# Patient Record
Sex: Female | Born: 1965
Health system: Southern US, Community
[De-identification: ages and names within clinical notes are randomized; demographics above are authoritative.]

## PROBLEM LIST (undated history)

## (undated) DIAGNOSIS — F988 Other specified behavioral and emotional disorders with onset usually occurring in childhood and adolescence: Secondary | ICD-10-CM

## (undated) DIAGNOSIS — I10 Essential (primary) hypertension: Secondary | ICD-10-CM

## (undated) DIAGNOSIS — Q2730 Arteriovenous malformation, site unspecified: Secondary | ICD-10-CM

## (undated) DIAGNOSIS — G4730 Sleep apnea, unspecified: Secondary | ICD-10-CM

## (undated) HISTORY — DX: Other specified behavioral and emotional disorders with onset usually occurring in childhood and adolescence: F98.8

---

## 1990-05-29 HISTORY — PX: TUBAL LIGATION: SHX77

## 1999-02-13 ENCOUNTER — Emergency Department (HOSPITAL_COMMUNITY): Admission: EM | Admit: 1999-02-13 | Discharge: 1999-02-13 | Payer: Self-pay | Admitting: Emergency Medicine

## 1999-02-13 ENCOUNTER — Encounter: Payer: Self-pay | Admitting: Emergency Medicine

## 1999-11-14 ENCOUNTER — Other Ambulatory Visit: Admission: RE | Admit: 1999-11-14 | Discharge: 1999-11-14 | Payer: Self-pay | Admitting: Obstetrics and Gynecology

## 2000-10-18 ENCOUNTER — Ambulatory Visit (HOSPITAL_COMMUNITY): Admission: RE | Admit: 2000-10-18 | Discharge: 2000-10-18 | Payer: Self-pay | Admitting: Internal Medicine

## 2000-10-19 ENCOUNTER — Encounter: Payer: Self-pay | Admitting: Internal Medicine

## 2000-10-29 ENCOUNTER — Encounter: Payer: Self-pay | Admitting: Internal Medicine

## 2000-10-29 ENCOUNTER — Encounter (INDEPENDENT_AMBULATORY_CARE_PROVIDER_SITE_OTHER): Payer: Self-pay | Admitting: *Deleted

## 2000-10-29 ENCOUNTER — Ambulatory Visit (HOSPITAL_COMMUNITY): Admission: RE | Admit: 2000-10-29 | Discharge: 2000-10-29 | Payer: Self-pay | Admitting: Internal Medicine

## 2001-05-29 HISTORY — PX: WISDOM TOOTH EXTRACTION: SHX21

## 2002-03-04 ENCOUNTER — Other Ambulatory Visit: Admission: RE | Admit: 2002-03-04 | Discharge: 2002-03-04 | Payer: Self-pay | Admitting: Gynecology

## 2003-08-12 ENCOUNTER — Ambulatory Visit (HOSPITAL_COMMUNITY): Admission: RE | Admit: 2003-08-12 | Discharge: 2003-08-12 | Payer: Self-pay | Admitting: Family Medicine

## 2004-02-02 ENCOUNTER — Other Ambulatory Visit: Admission: RE | Admit: 2004-02-02 | Discharge: 2004-02-02 | Payer: Self-pay | Admitting: Obstetrics and Gynecology

## 2004-02-04 ENCOUNTER — Encounter: Admission: RE | Admit: 2004-02-04 | Discharge: 2004-02-04 | Payer: Self-pay | Admitting: *Deleted

## 2004-08-19 ENCOUNTER — Ambulatory Visit (HOSPITAL_COMMUNITY): Admission: RE | Admit: 2004-08-19 | Discharge: 2004-08-19 | Payer: Self-pay | Admitting: Family Medicine

## 2004-09-21 ENCOUNTER — Inpatient Hospital Stay (HOSPITAL_COMMUNITY): Admission: AD | Admit: 2004-09-21 | Discharge: 2004-09-22 | Payer: Self-pay | Admitting: Family Medicine

## 2004-11-15 ENCOUNTER — Emergency Department (HOSPITAL_COMMUNITY): Admission: EM | Admit: 2004-11-15 | Discharge: 2004-11-15 | Payer: Self-pay | Admitting: *Deleted

## 2005-11-30 ENCOUNTER — Other Ambulatory Visit: Admission: RE | Admit: 2005-11-30 | Discharge: 2005-11-30 | Payer: Self-pay | Admitting: Obstetrics and Gynecology

## 2006-08-16 ENCOUNTER — Ambulatory Visit (HOSPITAL_COMMUNITY): Admission: RE | Admit: 2006-08-16 | Discharge: 2006-08-16 | Payer: Self-pay | Admitting: Family Medicine

## 2008-07-13 ENCOUNTER — Emergency Department (HOSPITAL_COMMUNITY): Admission: EM | Admit: 2008-07-13 | Discharge: 2008-07-13 | Payer: Self-pay | Admitting: Emergency Medicine

## 2009-03-12 ENCOUNTER — Ambulatory Visit (HOSPITAL_COMMUNITY): Admission: RE | Admit: 2009-03-12 | Discharge: 2009-03-12 | Payer: Self-pay | Admitting: Family Medicine

## 2009-04-20 ENCOUNTER — Encounter: Admission: RE | Admit: 2009-04-20 | Discharge: 2009-04-20 | Payer: Self-pay | Admitting: Endocrinology

## 2009-04-20 ENCOUNTER — Other Ambulatory Visit: Admission: RE | Admit: 2009-04-20 | Discharge: 2009-04-20 | Payer: Self-pay | Admitting: Interventional Radiology

## 2009-10-29 ENCOUNTER — Ambulatory Visit (HOSPITAL_COMMUNITY): Admission: RE | Admit: 2009-10-29 | Discharge: 2009-10-29 | Payer: Self-pay | Admitting: Endocrinology

## 2010-02-14 ENCOUNTER — Ambulatory Visit (HOSPITAL_COMMUNITY): Admission: RE | Admit: 2010-02-14 | Discharge: 2010-02-14 | Payer: Self-pay | Admitting: Family Medicine

## 2010-05-29 HISTORY — PX: PARTIAL HYSTERECTOMY: SHX80

## 2010-06-19 ENCOUNTER — Encounter: Payer: Self-pay | Admitting: Endocrinology

## 2010-06-20 LAB — COMPREHENSIVE METABOLIC PANEL
ALT: 15 U/L (ref 0–35)
AST: 16 U/L (ref 0–37)
Albumin: 3.6 g/dL (ref 3.5–5.2)
Alkaline Phosphatase: 51 U/L (ref 39–117)
Creatinine, Ser: 0.72 mg/dL (ref 0.4–1.2)
Glucose, Bld: 91 mg/dL (ref 70–99)
Total Bilirubin: 0.4 mg/dL (ref 0.3–1.2)

## 2010-06-20 LAB — URINALYSIS, ROUTINE W REFLEX MICROSCOPIC
Bilirubin Urine: NEGATIVE
Nitrite: NEGATIVE
Specific Gravity, Urine: >1.03 — ABNORMAL HIGH (ref 1.005–1.030)
Urine Glucose, Fasting: NEGATIVE mg/dL
Urobilinogen, UA: 0.2 mg/dL (ref 0.0–1.0)

## 2010-06-20 LAB — CBC
HCT: 33.7 % — ABNORMAL LOW (ref 36.0–46.0)
Hemoglobin: 11.1 g/dL — ABNORMAL LOW (ref 12.0–15.0)
RBC: 4.25 MIL/uL (ref 3.87–5.11)
WBC: 8.1 10*3/uL (ref 4.0–10.5)

## 2010-06-20 LAB — URINE MICROSCOPIC-ADD ON

## 2010-06-20 LAB — SURGICAL PCR SCREEN
MRSA, PCR: NEGATIVE
Staphylococcus aureus: NEGATIVE

## 2010-06-21 ENCOUNTER — Ambulatory Visit (HOSPITAL_COMMUNITY)
Admission: RE | Admit: 2010-06-21 | Discharge: 2010-06-22 | Payer: Self-pay | Source: Home / Self Care | Attending: Obstetrics and Gynecology | Admitting: Obstetrics and Gynecology

## 2010-06-21 ENCOUNTER — Encounter (INDEPENDENT_AMBULATORY_CARE_PROVIDER_SITE_OTHER): Payer: Self-pay | Admitting: Obstetrics and Gynecology

## 2010-06-22 LAB — CBC
HCT: 29.6 % — ABNORMAL LOW (ref 36.0–46.0)
MCH: 26.2 pg (ref 26.0–34.0)
RBC: 3.66 MIL/uL — ABNORMAL LOW (ref 3.87–5.11)
WBC: 11 10*3/uL — ABNORMAL HIGH (ref 4.0–10.5)

## 2010-07-10 NOTE — Discharge Summary (Signed)
[name], [name] NO.:  [id number]  MEDICAL RECORD NO.:  [id number]          PATIENT TYPE:  OIB  LOCATION:  9315                          FACILITY:  [facility]  PHYSICIAN:  Randye Lobo, M.D.   DATE OF BIRTH:  November 12, 1965  DATE OF ADMISSION:  06/21/2010 DATE OF DISCHARGE:  06/22/2010                              DISCHARGE SUMMARY   ADMISSION DIAGNOSIS:  Menometrorrhagia.  DISCHARGE DIAGNOSES: 1. Menometrorrhagia. 2. Status post da Vinci total laparoscopic hysterectomy.  SIGNIFICANT OPERATIONS AND PROCEDURES:  The patient underwent a da Vinci total laparoscopic hysterectomy on June 21, 2010, at the [facility] [place] under the direction of Dr. Conley Simmonds and with the assistance of Gretchen Short, PA-C and Dr. Aram Beecham Romine.  ADMISSION HISTORY AND PHYSICAL EXAMINATION:  The patient is a 45 year old para 3 Caucasian female, status post bilateral tubal ligation who presented with heavy and irregular menstruation and a request for definitive surgical treatment.  The patient's symptoms have not been satisfactorily controlled with medical therapy.  She has had a benign endometrial biopsy and an ultrasound documenting a small uterine fibroid.  The patient has a history of thyroid nodules and is euthyroid and not taking thyroid medications.  The patient's surgical history is significant for a tubal ligation and a primary cesarean section.  On physical examination, the abdomen is noted to be obese, soft and nontender without hepatosplenomegaly or organomegaly.  There is a well- healed Pfannenstiel incision.  Pelvic exam demonstrated normal external genitalia and urethra.  There was menstrual bleeding at the time of her preoperative exam.  There was no uterine descensus noted.  The uterus was small and nontender and there was no evidence of adnexal masses nor tenderness.  The patient's preoperative hemoglobin was 11.1 and her TSH was 0.945.  HOSPITAL  COURSE:  The patient was admitted on June 21, 2010, at which time she underwent a Primary school teacher laparoscopic hysterectomy which was performed without complication.  Surgical findings included a 1.5 cm left corpus luteum cyst.  There was no evidence of adhesive disease nor endometriosis.  Postoperatively, the patient had a benign surgical recovery.  She had a morphine PCA and ketorolac to control her postoperative pain.  She had a Foley catheter overnight.  Her Foley catheter was removed in the morning of June 22, 2010, she has not voided yet.  The patient is ambulating and tolerating a regular diet.  The patient's postop day #1, hemoglobin is 9.6.  Her pathology report is pending at the time of her discharge.  DISPOSITION:  The patient is found to be in good condition and ready for discharge on postop day #1. 1. The patient was to be discharged to home after she voids. 2. The patient will take the following medications; Percocet 5/325 mg     one to two p.o. q.4-6 h. p.r.n. pain, Aleve over-the-counter     medication 1-2 p.o. q.8 h. p.r.n. pain.     The patient will continue with her iron tablet 1 p.o. daily.  She will     also continue with her daily Concerta and Wellbutrin XL.  She will     discontinue her progesterone. 3. The patient will have decreased activity.  She will not work for     the next 6 weeks.  She will not do any heavy lifting or exercise     for 6 weeks.  She will not have sexual activity for 6 weeks.  She     will not drive for 1-2 weeks. 4. The patient will follow up in the office in 5 weeks for her first     postoperative visit. 5. The patient will call if she experiences any problems with fever,     nausea or vomiting, pain uncontrolled by her medication, heavy     vaginal bleeding, incisional drainage or redness, or any other     concerns.     Randye Lobo, M.D.     BES/MEDQ  D:  06/22/2010  T:  06/23/2010  Job:   [id number]  Electronically Signed by Conley Simmonds M.D. on 07/10/2010 08:44:10 AM

## 2010-07-10 NOTE — H&P (Signed)
NAMEBAILLEY, GUILFORD NO.:  [id number]  MEDICAL RECORD NO.:  [id number]         PATIENT TYPE:  WAMB  LOCATION:                                [facility]ACILITY[facility]  [facility]  PHYSICIAN:  Randye Lobo, M.D.   DATE OF BIRTH:  [birth year]  DATE OF ADMISSION:  06/21/2010 DATE OF DISCHARGE:                             HISTORY & PHYSICAL   CHIEF COMPLAINT:  Heavy and irregular vaginal bleeding.  HISTORY OF PRESENT ILLNESS:  The patient is a 45 year old gravida 3, para 3-0-0-3 Caucasian female, status post bilateral tubal ligation, who presents with heavy and irregular menstrual periods since April 2011, who now requests definitive surgical treatment.  The patient's menstruation has been occurring irregularly and when she does have bleeding, this can last for up to 11-12 days and is accompanied by clotting.  In the past, the patient has been treated with combined oral contraceptive pills which has caused elevation of her blood pressure and was accompanied by headaches.  The patient has also been treated with cyclic Provera therapy.  The patient had an ultrasound performed in April 2011 which documented a 7.29-mm posterior fibroid.  The endometrial stripe was 9.17 mm.  The right ovary was normal and the left ovary was not identified.  The patient had an endometrial biopsy performed on May 31, 2010, which documented benign proliferative endometrium with no atypia, hyperplasia, or malignancy.  The patient's hemoglobin on June 09, 2010, measured 11.1.  The patient does have a history of thyroid nodules by ultrasound in 2010.  Her TSH on June 09, 2010, measured 0.945, and the patient is not on any thyroid medication.  The patient is now requesting definitive surgical treatment of her bleeding.  PAST OBSTETRIC AND GYNECOLOGIC HISTORIES: Significant for 3 prior deliveries.  The patient's first delivery was  by cesarean section followed by 2 VBACs.  The patient is also status    post bilateral tubal ligation.  The patient's last Pap smear was  performed in April 2011 and this documented endometrial cells with no  evidence of intraepitheliallesions or malignancy.  Her last mammogram  was performed in October 2010 and was within normal limits.  PAST MEDICAL HISTOR: 1. Thyroid nodules.  SURGICAL HISTORY: 1. Status post cesarean section x1. 2. Status post bilateral tubal ligation.  MEDICATIONS:  Wellbutrin, Concerta, Provera 10 mg p.o. daily.  ALLERGIES:  No known drug allergies.  SOCIAL HISTORY:  The patient has been married for 11 years.  She has 3 children.  She denies the use of tobacco, alcohol, or illicit drugs.  FAMILY HISTORY:  Positive for breast cancer and the patient's mother diagnosed postmenopausally.  There is no family history of uterine, ovarian, or colon cancer.  REVIEW OF SYSTEMS:  The patient reports dizziness and no energy.  PHYSICAL EXAMINATION:   HEIGHT: 5 feet 3 inches. WEIGHT: 246 pounds. BLOOD PRESSURE: 118/70. HEENT:  Normocephalic, atraumatic. NECK:  There is a slightly firm, irregular thyroid palpated.  It is nontender. LUNGS:  Clear to auscultation bilaterally. HEART:  S1 and S2 with a regular rate and rhythm. ABDOMEN:  There is a well-healed Pfannenstiel incision.  The  abdomen is soft and nontender and without evidence of hepatosplenomegaly or organomegaly. BREASTS:  Demonstrates scattered fibrocystic change of the breasts bilaterally.  There is no dominant mass, evidence of retraction, evidence of nipple discharge, or axillary adenopathy. PELVIC:  Normal external genitalia and urethra.  There is menstrual blood in the vagina.  There are no lesions of the cervix or the vagina present.  The cervix is noted to be very high in the vaginal vault and there is no evidence of uterine descensus.  On bimanual exam, the uterus is small and nontender and there is no evidence of adnexal masses nor tenderness.  LABORATORY DATA:   Preoperative laboratory studies from June 09, 2010, hemoglobin 11.1, hematocrit 35.5, white blood cell count 8.2, platelets 352,000.  Sodium 138, potassium 4.5, chloride 103, bicarbonate 28, BUN 14, creatinine 0.85, glucose 87.  AST 24, ALT 21.  Total bilirubin 0.4.  TSH 0.945.  IMPRESSION:  The patient is a 45 year old para 3 female, status post bilateral tubal ligation, who presents with menometrorrhagia and mild anemia.  The patient has a very small fibroid based on prior ultrasound examination.  The patient has thyroid nodules and is euthyroid and not on medication.  PLAN:  The patient will undergo a Engineer, building services robotic total laparoscopic hysterectomy.  Risks, benefits, and alternatives have been discussed with the patient who wishes to proceed.     Randye Lobo, M.D.     BES/MEDQ  D:  06/20/2010  T:  06/20/2010  Job:  [id number]  Electronically Signed by Conley Simmonds M.D. on 07/10/2010 08:49:31 AM

## 2010-07-10 NOTE — Op Note (Signed)
NAMEPRESTINA, [name] NO.:  [id number]  MEDICAL RECORD NO.:  [id number]          PATIENT TYPE:  OIB  LOCATION:  9315                          FACILITY:  [facility]  PHYSICIAN:  Randye Lobo, M.D.   DATE OF BIRTH:  [birth year]  DATE OF PROCEDURE:  06/21/2010 DATE OF DISCHARGE:                              OPERATIVE REPORT   PREOPERATIVE DIAGNOSIS:  Menometrorrhagia.  POSTOPERATIVE DIAGNOSIS:  Menometrorrhagia.  PROCEDURE:  Da Vinci robotic total laparoscopic hysterectomy.  SURGEON:  Randye Lobo, MD.  ASSISTANTS: 1. Cynthia P. Romine, MD 2. Gretchen Short, PAC  PROCTOR:  Edwena Felty. Romine, MD>  ANESTHESIA:  General endotracheal, local with 0.25% Marcaine.  INTRAVENOUS FLUIDS:  2100 mL Ringers lactate.  ESTIMATED BLOOD LOSS:  75 mL.  URINE OUTPUT:  400 mL.  COMPLICATIONS:  None.  INDICATIONS FOR PROCEDURE:  The patient is a 45 year old para 3 Caucasian female, status post bilateral tubal ligation, who presented with heavy and irregular menstrual bleeding and a request for hysterectomy procedure.  The patient did not have satisfactory control of her symptoms with medical therapy.  Ultrasound has demonstrated a 7.29-mm posterior uterine fibroid.  An endometrial biopsy documented benign proliferative endometrium.  The patient has a surgical history significant for a prior cesarean section and a tubal ligation.  A plan is now made to proceed with a da Vinci total laparoscopic hysterectomy after risks, benefits, and alternatives are reviewed.  FINDINGS:  Laparoscopy demonstrated a normal uterus.  The fallopian tubes were consistent with prior bilateral tubal ligation.  There was a 1.5-cm left corpus luteum cyst.  The right ovary was unremarkable. There was no evidence of any adhesive disease nor endometriosis appreciated.  SPECIMENS:  The uterus and cervix were sent to pathology.  PROCEDURE:  The patient was reidentified in the preoperative hold  area. She received cefotetan for IV antibiotic prophylaxis and she received TED hose and PAS stockings for DVT prophylaxis.  The patient was escorted to the operating room.  The patient was placed on the operating room table on top of the bean bag.  The right arm was tucked and protected and the left arm was left on an arm board.  The patient's left arm was supported and the shoulders were supported and padded as well.  General endotracheal anesthesia was induced.  The patient was then placed in the stirrups.  The bean bag was inflated.  The patient received proper protection around the facial area.  The abdomen and the vagina were then sterilely prepped.  A Foley catheter was placed inside the bladder.  A speculum was placed inside the vagina.  The anterior cervical lip was grasped with a single-tooth tenaculum.  The uterus was sounded to 10 cm.  The cervix was then dilated to a #23 Pratt dilator.  The RUMI instrument was then placed inside the uterine cavity after the balloon was tested.  The balloon was inflated.  The remaining vaginal instruments were removed.  The patient was then sterilely draped.  The procedure began by creating a 12-mm incision above the umbilicus. Dissection down to the fascia was performed with an Allis clamp.  A 10- mm trocar was then inserted directly into the peritoneal cavity without difficulty.  The laparoscope confirmed proper placement.  A CO2 pneumoperitoneum was then achieved.  Three trocar sites were mapped on the abdomen.  The laparoscope was placed at this time and the abdomen was transilluminated at the proposed trocar sites in order to avoid any vessel injury.  A 7-mm incision was created 10 cm to the right of the umbilical incision and slightly superior to this.  The same was performed on the left-hand side.  The final third trocar site was placed in the right lower quadrant 10 cm lateral to the right upper port incision.  All trocars  were placed under direct visualization of the laparoscope.  The patient was then placed in the Trendelenburg position.  The robot was docked at this time.  Instruments were placed into the perineal cavity under the visualization of the laparoscope.  At this time, I went to the surgical console.  The procedure began by identifying the ureters bilaterally.  The mesosalpinx on the patient's left-hand side was then grasped with the PK instrument and was cauterized and cut with a laparoscopic scissors.  The same was performed along the left round ligament.  The left utero- ovarian ligament was then grasped with a PK instrument, cauterized and sharply divided with a laparoscopic scissors.  Dissection through the anterior and posterior leaves of the broad ligament was then performed with monopolar cautery.  The bladder flap was taken down with monopolar cautery.  The same procedure that was performed on the patient's left- hand side was then repeated on the right-hand side again after the right ureter was identified.  The bladder flap was taken down completely with monopolar cautery and sharp dissection with the scissors.  The uterine vessels were then skeletonized and were cauterized with a PK instrument and then cut with the scissors.  The remaining peritoneum was taken down posteriorly.  During this skeletonizing of the uterine vessels on the patient's left- hand side, there was a branch of the uterine artery that bled slightly and this was easily controlled with the PK instrument.  The KOH ring was then pushed up into the vagina so that it could be easily delineated.  Monopolar cautery was then used to circumscribe the vagina directly over the KOH ring.  The specimen was then completely freed and was retracted and pulled into the vagina.  The pelvis was then suctioned.  The vaginal cuff was identified. Hemostasis was good at this time.  The vaginal cuff was closed with a series of  three figure-of-eight sutures of 0 Vicryl and one simple suture of 0 Vicryl in the midline.  The pelvis was once again irrigated and suctioned.  All of the operative pedicles were examined and were noted to be hemostatic.  Each of the ureters were identified and noted to peristalse.  The robot was undocked at this point.  The trocars were removed under visualization of the laparoscope.  The umbilical trocar remained and the CO2 gas was released from within the peritoneal cavity.  The umbilical trocar was then removed.  The right lower quadrant and the umbilical incisions were closed along the fascia with simple through-and-through sutures of 0 Vicryl.  Skin incisions were closed with subcuticular sutures of 3-0 plain.  Dermabond was placed over the incisions.  The uterus and cervix were sent to pathology.  The patient was cleansed with Betadine, taken out of the dorsal lithotomy position, awakened and extubated.  She was  escorted to the recovery room in stable condition.  There were no complications to the procedure.  All needle, instrument, and sponge counts were correct.     Randye Lobo, M.D.     BES/MEDQ  D:  06/21/2010  T:  06/22/2010  Job:  [id number]  Electronically Signed by Conley Simmonds M.D. on 07/10/2010 08:53:22 AM

## 2010-09-13 LAB — URINALYSIS, ROUTINE W REFLEX MICROSCOPIC
Nitrite: NEGATIVE
Protein, ur: NEGATIVE mg/dL
Urobilinogen, UA: 0.2 mg/dL (ref 0.0–1.0)

## 2010-09-13 LAB — URINE MICROSCOPIC-ADD ON

## 2010-10-14 NOTE — H&P (Signed)
[name], [name] NO.:  [id number]   MEDICAL RECORD NO.:  [id number]          PATIENT TYPE:  INP   LOCATION:  A214                          FACILITY:  [facility]   PHYSICIAN:  Corrie Mckusick, M.D.  DATE OF BIRTH:  [birth year]   DATE OF ADMISSION:  09/21/2004  DATE OF DISCHARGE:  LH                                HISTORY & PHYSICAL   ADMISSION DIAGNOSIS:  Dyspnea.   ADDITIONAL DIAGNOSIS:  Chest pain.   ADMITTING CONDITION:  Guarded.   HISTORY OF PRESENT ILLNESS:  This is a 45 year old female with a history of  ADHD, but no other prior history of asthma or cardiac conditions, who  presents with one month of dyspnea.  I saw her on August 19, 2004, in the  office with a bronchitis and a history of pneumonia.  She was placed on  Augmentin, as well as Anaplex and albuterol at that time.  She had a chest x-  ray which was negative at that time.  She had no chest pain or other  cardiovascular symptomatology then.  She since that time has not used the  albuterol, but has continued to complain for a month of intermittent  dyspnea.  She does state that there is some chest heaviness in the  midepigastrium, as well as the midsternal area.  There is no radiation of  this pain, although she does have shoulder pain at times.  She does not  think this is related.  There is no associated diaphoresis or nausea.   She does have a strong family history of cardiac disease, so we had a good  discussion today in the office and decided to go ahead and put her in the  hospital for rule out and further workup.  She was given nitroglycerin in  the office with no improvement.  She was also given an aspirin.  EKG in the  office showed normal sinus rhythm, normal axis, normal intervals and no  acute ST changes.  No GERD-related symptoms.  She does not feel like this is  meal related at all.   PAST MEDICAL HISTORY:  ADHD.   PAST SURGICAL HISTORY:  1.  C-section.  2.  Tubal ligation.   MEDICATIONS ON ADMISSION:  Concerta 27 mg in the morning.   SOCIAL HISTORY:  She does not drink nor smoke.   ALLERGIES:  No known drug allergies.   FAMILY HISTORY:  Significant for coronary artery disease.   PHYSICAL EXAMINATION:  VITAL SIGNS:  Temperature 98.6 degrees, pulse 68,  respirations 16, blood pressure 120/78.  WEIGHT:  266 pounds.  GENERAL APPEARANCE:  A pleasant female in no acute distress.  HEENT:  Normocephalic and atraumatic.  Pupils equal, round and reactive to  light.  Extraocular muscles intact.  Nasopharynx clear.  NECK:  Supple.  No lymphadenopathy.  CHEST:  Clear to auscultation bilaterally.  CARDIOVASCULAR:  Regular rate and rhythm.  Normal S1 and S2.  No murmurs,  rubs or gallops.  ABDOMEN:  Soft, nontender and nondistended.  EXTREMITIES:  No cyanosis, clubbing or edema.   ASSESSMENT:  A 45 year old female  with dyspnea and chest heaviness.   PLAN:  1.  Admit to 2A telemetry.  2.  Rule out with cardiac enzymes q.8h. x 3.  3.  CBC and Chem-12 on admission.  4.  Fasting laboratories in the morning.  5.  EKG on admission.  6.  Echocardiogram.  7.  Chest CT and D-dimer on admission.  8.  Will consult cardiology for further workup.  9.  Will cover with Xopenex 1.25 mg nebulizers q.8h. while awake to see if      this improves her dyspnea.  10. Will continue to follow closely.       JCG/MEDQ  D:  09/21/2004  T:  09/21/2004  Job:  [id number]

## 2010-10-14 NOTE — Procedures (Signed)
[name], [name]              ACCOUNT NO.:  [id number]   MEDICAL RECORD NO.:  [id number]          PATIENT TYPE:  INP   LOCATION:  A214                          FACILITY:  [facility]   PHYSICIAN:  Dani Gobble, MD       DATE OF BIRTH:  05-05-1966   DATE OF PROCEDURE:  09/22/2004  DATE OF DISCHARGE:  09/22/2004                                  ECHOCARDIOGRAM   INDICATION:  Dyspnea, chest pain.   Technical quality of the study is adequate.   The aorta is within normal limits at 2.5 cm.   The left atrium is mildly dilated, measured at 4.6 cm. No obvious clots or  masses were appreciated and the patient appeared to be in sinus rhythm  during this procedure.   The interventricular septum and posterior wall are within normal limits in  thickness at 1.1 cm for each.   The aortic valve appeared thin, trileaflet, and pliable with normal leaflet  excursion. No significant aortic insufficiency is noted. Doppler  interrogation of the aortic valve is within normal limits.   The mitral valve also appeared structurally normal. There is flat coaptation  of the mitral valve leaflets without mitral valve prolapse noted. Trivial  mitral regurgitation is noted. Doppler interrogation of the mitral valve is  within normal limits.   The pulmonic valve is incompletely visualized, but appeared to be grossly  structurally normal.   The tricuspid valve also appears grossly structurally normal with trivial  tricuspid regurgitation suggested by Doppler.   The left ventricle is normal in size with the LVIDD measured at 4.8 cm and  the LVISD measured at 3.2 cm. Overall left ventricular systolic function is  normal and no regional wall motion abnormalities are noted. Doppler inflow  signal is normal for age.   The right ventricle is moderately dilated with preserved right ventricular  systolic function. The right atrium is mildly dilated.   IMPRESSION:  1.  MILD TO MODERATE LEFT ATRIAL ENLARGEMENT.  2.   TRIVIAL MITRAL AND TRICUSPID REGURGITATION.  3.  NORMAL LEFT VENTRICULAR SIZE AND SYSTOLIC FUNCTION WITHOUT REGIONAL WALL      MOTION ABNORMALITY NOTED.  4.  MODERATELY DILATED RIGHT VENTRICLE WITH PRESERVED RIGHT VENTRICULAR      SYSTOLIC FUNCTION.      AB/MEDQ  D:  09/22/2004  T:  09/22/2004  Job:  [id number]

## 2011-05-30 HISTORY — PX: HERNIA REPAIR: SHX51

## 2011-10-26 ENCOUNTER — Other Ambulatory Visit (HOSPITAL_COMMUNITY): Payer: Self-pay | Admitting: Family Medicine

## 2011-10-26 ENCOUNTER — Ambulatory Visit (HOSPITAL_COMMUNITY)
Admission: RE | Admit: 2011-10-26 | Discharge: 2011-10-26 | Disposition: A | Discharge status: Home / Self Care | Payer: BC Managed Care – PPO | Source: Ambulatory Visit | Attending: Family Medicine | Admitting: Family Medicine

## 2011-10-26 DIAGNOSIS — N200 Calculus of kidney: Secondary | ICD-10-CM | POA: Insufficient documentation

## 2011-10-26 DIAGNOSIS — M545 Low back pain, unspecified: Secondary | ICD-10-CM | POA: Insufficient documentation

## 2011-10-26 DIAGNOSIS — R109 Unspecified abdominal pain: Secondary | ICD-10-CM | POA: Insufficient documentation

## 2011-10-26 DIAGNOSIS — R319 Hematuria, unspecified: Secondary | ICD-10-CM | POA: Insufficient documentation

## 2011-11-16 ENCOUNTER — Encounter (INDEPENDENT_AMBULATORY_CARE_PROVIDER_SITE_OTHER): Payer: Self-pay | Admitting: *Deleted

## 2011-11-20 ENCOUNTER — Encounter (INDEPENDENT_AMBULATORY_CARE_PROVIDER_SITE_OTHER): Payer: Self-pay | Admitting: Internal Medicine

## 2011-11-20 ENCOUNTER — Ambulatory Visit (INDEPENDENT_AMBULATORY_CARE_PROVIDER_SITE_OTHER): Payer: BC Managed Care – PPO | Admitting: Internal Medicine

## 2011-11-20 VITALS — BP 118/70 | HR 60 | Temp 98.9°F | Ht 63.0 in | Wt 237.2 lb

## 2011-11-20 DIAGNOSIS — R1031 Right lower quadrant pain: Secondary | ICD-10-CM

## 2011-11-20 DIAGNOSIS — K625 Hemorrhage of anus and rectum: Secondary | ICD-10-CM

## 2011-11-20 DIAGNOSIS — G8929 Other chronic pain: Secondary | ICD-10-CM

## 2011-11-20 NOTE — Patient Instructions (Addendum)
CT abdomen and pelvis with CM.

## 2011-11-20 NOTE — Progress Notes (Signed)
Subjective:     Patient ID: [name], female   DOB: Aug 06, 1965, 46 y.o.   MRN: [id number]  HPI Referred by Dr. Phillips Odor for abdominal pain. C/o low back pain. Urinary frequency.  She also had umbilical pain. She made an appt with Dr Phillips Odor. She was seen. Urine showed she had blood.  She was started on Flomax. She did have a kidney stone. She has had 2 epiosodes of intense pain. She tell me that she felt a "knot" in her epigastric region. She tells me today that the only pain she is having is her lower back.  Her appetite is good. No weight loss. She has a BM daily.Stools are normal size. No melena . She occasionally see blood about 3 times a week when she wipes. No acid reflux.  10/26/2011 Abdominal film IMPRESSION:  1. Tiny stone in the mid left kidney, unchanged since the prior CT  scan.  2. Tiny densities in the right and left sides of the pelvis could  possibly represent distal ureteral calculi.  3. Otherwise benign-appearing abdomen and pelvis.  10/26/2011 NA 139, K 4.3, Chloride 104, Glucose 87, Total bioi 0.68, ALP 46, AST 17, ALT 21, Total protein 6.8, Albumin 4.1, H and H 13.8 and 40.3, MCV 84.8, Platelet ct 321.   Review of Systems see hpi Current Outpatient Prescriptions  Medication Sig Dispense Refill  . beta carotene w/minerals (OCUVITE) tablet Take 1 tablet by mouth daily.      . methylphenidate (CONCERTA) 36 MG CR tablet Take 36 mg by mouth every morning.       Past Medical History  Diagnosis Date  . ADD (attention deficit disorder)    Past Surgical History  Procedure Date  . Cesarean section   . Partial hysterectomy   . Tubal ligation    History   Social History  . Marital Status: Married    Spouse Name: N/A    Number of Children: N/A  . Years of Education: N/A   Occupational History  . Not on file.   Social History Main Topics  . Smoking status: Never Smoker   . Smokeless tobacco: Not on file  . Alcohol Use: Yes     rare occasion  . Drug Use:  Yes  . Sexually Active: Not on file   Other Topics Concern  . Not on file   Social History Narrative  . No narrative on file   Family Status  Relation Status Death Age  . Mother Alive     hypertension,   . Father Deceased     CAD  . Sister Alive     Two have CAD   Allergies no known allergies     Objective:   Physical Exam Filed Vitals:   11/20/11 0928  Height: 5\' 3"  (1.6 m)  Weight: 237 lb 3.2 oz (107.593 kg)   Alert and oriented. Skin warm and dry. Oral mucosa is moist.   . Sclera anicteric, conjunctivae is pink. Thyroid not enlarged. No cervical lymphadenopathy. Lungs clear. Heart regular rate and rhythm.  Abdomen is soft. Bowel sounds are positive. No hepatomegaly. No abdominal masses felt. Tenderness rt lower quadrant and umblical:slight. No edema to lower extremities. Patient is alert and oriented.      Assessment:    Umblical and rt lower quadrant tenderness ? Etiology.  umblical mass. Hx of kidney stones.     Plan:    May need a CT scan of abdomen with CM. Further recommendations once we have CT  back. Will get lab work from [place].

## 2011-11-24 ENCOUNTER — Encounter (INDEPENDENT_AMBULATORY_CARE_PROVIDER_SITE_OTHER): Payer: Self-pay

## 2011-11-27 ENCOUNTER — Ambulatory Visit (HOSPITAL_COMMUNITY)
Admission: RE | Admit: 2011-11-27 | Discharge: 2011-11-27 | Disposition: A | Discharge status: Home / Self Care | Payer: BC Managed Care – PPO | Source: Ambulatory Visit | Attending: Internal Medicine | Admitting: Internal Medicine

## 2011-11-27 DIAGNOSIS — K625 Hemorrhage of anus and rectum: Secondary | ICD-10-CM

## 2011-11-27 DIAGNOSIS — R9389 Abnormal findings on diagnostic imaging of other specified body structures: Secondary | ICD-10-CM | POA: Insufficient documentation

## 2011-11-27 DIAGNOSIS — R1031 Right lower quadrant pain: Secondary | ICD-10-CM | POA: Insufficient documentation

## 2011-11-27 DIAGNOSIS — R319 Hematuria, unspecified: Secondary | ICD-10-CM | POA: Insufficient documentation

## 2011-11-27 DIAGNOSIS — G8929 Other chronic pain: Secondary | ICD-10-CM | POA: Insufficient documentation

## 2011-11-27 DIAGNOSIS — N200 Calculus of kidney: Secondary | ICD-10-CM | POA: Insufficient documentation

## 2011-11-27 DIAGNOSIS — K439 Ventral hernia without obstruction or gangrene: Secondary | ICD-10-CM | POA: Insufficient documentation

## 2011-11-27 DIAGNOSIS — M545 Low back pain, unspecified: Secondary | ICD-10-CM | POA: Insufficient documentation

## 2011-11-27 MED ORDER — IOHEXOL 300 MG/ML  SOLN: 100.0000 mL | Freq: Once | INTRAMUSCULAR | Status: AC | PRN

## 2011-12-12 ENCOUNTER — Telehealth (INDEPENDENT_AMBULATORY_CARE_PROVIDER_SITE_OTHER): Payer: Self-pay

## 2011-12-12 NOTE — Telephone Encounter (Signed)
Patient return call--- okay per patient to move her appointment from 12/14/11 to 12/15/11 @ 2:45 w/Dr. Johna Sheriff.

## 2011-12-12 NOTE — Telephone Encounter (Signed)
Left voice message for patient to call our office, need to change her appointment to 12/15/11 @ 2:45 pm w/Dr. Johna Sheriff due to surgery add on for 12/14/11 before am office.

## 2011-12-14 ENCOUNTER — Ambulatory Visit (INDEPENDENT_AMBULATORY_CARE_PROVIDER_SITE_OTHER): Payer: BC Managed Care – PPO | Admitting: General Surgery

## 2011-12-15 ENCOUNTER — Encounter (INDEPENDENT_AMBULATORY_CARE_PROVIDER_SITE_OTHER): Payer: Self-pay | Admitting: General Surgery

## 2011-12-15 ENCOUNTER — Ambulatory Visit (INDEPENDENT_AMBULATORY_CARE_PROVIDER_SITE_OTHER): Payer: BC Managed Care – PPO | Admitting: General Surgery

## 2011-12-15 VITALS — BP 130/88 | HR 71 | Temp 98.4°F | Resp 16 | Ht 63.0 in | Wt 240.4 lb

## 2011-12-15 DIAGNOSIS — K439 Ventral hernia without obstruction or gangrene: Secondary | ICD-10-CM

## 2011-12-15 NOTE — Progress Notes (Signed)
Subjective:   abdominal pain, hernia  Patient ID: [name], female   DOB: 1966-03-22, 46 y.o.   MRN: [id number]  HPI Patient is a 46 year old female referred through the courtesy of Dr. Phillips Odor for apparent abdominal hernia.about 4 months ago the patient had a brief episode of fairly severe pain at her umbilicus. This resolved. She did however had a second episode of quite severe pain at her umbilicus and on lying down she was able to see and feel a lump in her abdomen just above the umbilicus about the size of a "goose egg". She has not had recurrent symptoms since that time. The patient saw Dr. Phillips Odor for this and was referredto Dr. Karilyn Cota history is also having some red blood on her toilet tissue. The patient was evaluated and a CT scan of the abdomen was obtained. I've reviewed this study and this shows a fat-containing anterior abdominal wall hernia just above the umbilicus, moderate size. There was also a tiny renal calculus and probable corpus luteum cyst. The patient is referred for evaluation. She has not had nausea or vomiting. She has had laparoscopic assisted hysterectomy as well as laparoscopic tubal ligation.  Past Medical History  Diagnosis Date  . ADD (attention deficit disorder)    Past Surgical History  Procedure Date  . Cesarean section   . Partial hysterectomy   . Tubal ligation    Current Outpatient Prescriptions  Medication Sig Dispense Refill  . beta carotene w/minerals (OCUVITE) tablet Take 1 tablet by mouth daily.      . methylphenidate (CONCERTA) 36 MG CR tablet Take 36 mg by mouth every morning.       No Known Allergies History  Substance Use Topics  . Smoking status: Never Smoker   . Smokeless tobacco: Not on file  . Alcohol Use: Yes     rare occasion     Review of Systems  Respiratory: Negative.   Cardiovascular: Negative.   Gastrointestinal: Positive for abdominal pain and anal bleeding. Negative for nausea and vomiting.       Objective:   Physical Exam General: Obese otherwise well-appearing Caucasian female Skin: Warm and dry HEENT: There is a palpable goiter greater on the left. Sclera nonicteric. No other masses. Lymph nodes: No cervical, supraclavicular, inguinal nodes palpable Lungs: Clear equal breath sounds without increased work of breathing Cardiac: Regular rate and rhythm. Peripheral pulses intact. No edema. Abdomen. Obese. Healed Pfannenstiel incision. I cannot see a definite trocar site in the periumbilical area. With the patient standing and straining there is a definite palpable reducible tender hernia presenting just above the umbilicus. With the patient lying there is some mild diffuse lower abdominal tenderness. No masses or organomegaly. Extremities: No edema Neurologic: Alert and oriented affect normal    Assessment:     Symptomatic abdominal wall hernia, possible incisional trocar site hernia versus primary supraumbilical hernia. Particularly with the patient's weight I think that a laparoscopic approach would be best. I discussed this with her including the indications for the surgery and risks of anesthetic complications, bleeding, infection, visceral injury, and recurrence. She understands and wants to proceed with repair.    Plan:     Laparoscopic repair of ventral hernia under general anesthesia with overnight hospitalization.

## 2011-12-27 ENCOUNTER — Telehealth (INDEPENDENT_AMBULATORY_CARE_PROVIDER_SITE_OTHER): Payer: Self-pay

## 2011-12-27 ENCOUNTER — Other Ambulatory Visit (INDEPENDENT_AMBULATORY_CARE_PROVIDER_SITE_OTHER): Payer: Self-pay

## 2011-12-27 DIAGNOSIS — K439 Ventral hernia without obstruction or gangrene: Secondary | ICD-10-CM

## 2011-12-27 MED ORDER — HYDROCODONE-ACETAMINOPHEN 5-325 MG PO TABS: 1.0000 | ORAL_TABLET | Freq: Four times a day (QID) | ORAL | Status: AC | PRN

## 2011-12-27 NOTE — Telephone Encounter (Signed)
Patient would like to see if she could move her surgery up to an earlier date than September.  She's having an increase in pain which not controlled well with OTC pain medications.

## 2011-12-27 NOTE — Telephone Encounter (Signed)
Patient called stating she's had an increase in abdominal pain, she's scheduled for Ventral Hernia Repair in September.  Patient would like to know if her surgery date can be moved up due to an increase in pain.  Patient denies having any nausea/vomiting or fever, patient having bowel movements.  Patient taking Tylenol & Ibuprofen for pain control.

## 2011-12-28 ENCOUNTER — Encounter (HOSPITAL_COMMUNITY): Payer: Self-pay | Admitting: Pharmacy Technician

## 2012-01-05 ENCOUNTER — Encounter (HOSPITAL_COMMUNITY): Payer: Self-pay

## 2012-01-05 ENCOUNTER — Encounter (HOSPITAL_COMMUNITY)
Admission: RE | Admit: 2012-01-05 | Discharge: 2012-01-05 | Disposition: A | Discharge status: Home / Self Care | Payer: BC Managed Care – PPO | Source: Ambulatory Visit | Attending: General Surgery | Admitting: General Surgery

## 2012-01-05 HISTORY — DX: Sleep apnea, unspecified: G47.30

## 2012-01-05 LAB — CBC
Hemoglobin: 13.1 g/dL (ref 12.0–15.0)
MCHC: 34.1 g/dL (ref 30.0–36.0)
Platelets: 277 10*3/uL (ref 150–400)
RDW: 13 % (ref 11.5–15.5)

## 2012-01-05 LAB — SURGICAL PCR SCREEN
MRSA, PCR: NEGATIVE
Staphylococcus aureus: NEGATIVE

## 2012-01-05 NOTE — Patient Instructions (Addendum)
20 VERLIE LIOTTA  01/05/2012   Your procedure is scheduled on:  01-09-2012  Report to [facility] at   AM.  Call this number if you have problems the morning of surgery: [phone]   Remember: bring driver for day of surgery   Do not eat food or drink liquids:After Midnight.  .  Take these medicines the morning of surgery with A SIP OF WATER: concerta, hydrocodone if needed   Do not wear jewelry or make up.  Do not wear lotions, powders, or perfumes.Do not wear deodorant.    Do not bring valuables to the hospital.  Contacts, dentures or bridgework may not be worn into surgery.  Leave suitcase in the car. After surgery it may be brought to your room.  For patients admitted to the hospital, checkout time is 11:00 AM the day    discharge                             Special Instructions: CHG Shower Use Special Wash: 1/2 bottle night before surgery and 1/2 bottle morning of surgery, use regular soap on face and front and back private area. Do  Not shave for 2 days before showers   Please read over the following fact sheets that you were given: MRSA Information  [facility] pre op nurse phone number [phone], call if needed

## 2012-01-09 ENCOUNTER — Ambulatory Visit (HOSPITAL_COMMUNITY): Payer: BC Managed Care – PPO | Admitting: Anesthesiology

## 2012-01-09 ENCOUNTER — Encounter (HOSPITAL_COMMUNITY)
Admission: RE | Disposition: A | Discharge status: Home / Self Care | Payer: Self-pay | Source: Ambulatory Visit | Attending: General Surgery

## 2012-01-09 ENCOUNTER — Inpatient Hospital Stay (HOSPITAL_COMMUNITY)
Admission: RE | Admit: 2012-01-09 | Discharge: 2012-01-12 | DRG: 160 | Disposition: A | Discharge status: Home / Self Care | Payer: BC Managed Care – PPO | Source: Ambulatory Visit | Attending: General Surgery | Admitting: General Surgery

## 2012-01-09 ENCOUNTER — Encounter (HOSPITAL_COMMUNITY): Payer: Self-pay | Admitting: Anesthesiology

## 2012-01-09 ENCOUNTER — Encounter (HOSPITAL_COMMUNITY): Payer: Self-pay | Admitting: *Deleted

## 2012-01-09 DIAGNOSIS — K436 Other and unspecified ventral hernia with obstruction, without gangrene: Principal | ICD-10-CM | POA: Diagnosis present

## 2012-01-09 DIAGNOSIS — K439 Ventral hernia without obstruction or gangrene: Secondary | ICD-10-CM

## 2012-01-09 DIAGNOSIS — E669 Obesity, unspecified: Secondary | ICD-10-CM | POA: Diagnosis present

## 2012-01-09 DIAGNOSIS — G8918 Other acute postprocedural pain: Secondary | ICD-10-CM | POA: Diagnosis not present

## 2012-01-09 DIAGNOSIS — Z01812 Encounter for preprocedural laboratory examination: Secondary | ICD-10-CM

## 2012-01-09 DIAGNOSIS — Z6841 Body Mass Index (BMI) 40.0 and over, adult: Secondary | ICD-10-CM

## 2012-01-09 HISTORY — PX: VENTRAL HERNIA REPAIR: SHX424

## 2012-01-09 SURGERY — REPAIR, HERNIA, VENTRAL, LAPAROSCOPIC
Anesthesia: General | Site: Abdomen | Wound class: Clean

## 2012-01-09 MED ORDER — ONDANSETRON HCL 4 MG PO TABS: 4.0000 mg | ORAL_TABLET | Freq: Four times a day (QID) | ORAL | Status: DC | PRN

## 2012-01-09 MED ORDER — HYDROMORPHONE HCL PF 1 MG/ML IJ SOLN
0.2500 mg | INTRAMUSCULAR | Status: DC | PRN
  Administered 2012-01-09 (×2): 0.5 mg via INTRAVENOUS

## 2012-01-09 MED ORDER — BIOTENE DRY MOUTH MT LIQD
15.0000 mL | Freq: Two times a day (BID) | OROMUCOSAL | Status: DC
  Administered 2012-01-09 – 2012-01-12 (×6): 15 mL via OROMUCOSAL

## 2012-01-09 MED ORDER — LIDOCAINE HCL (CARDIAC) 20 MG/ML IV SOLN
INTRAVENOUS | Status: DC | PRN
  Administered 2012-01-09: 100 mg via INTRAVENOUS

## 2012-01-09 MED ORDER — LACTATED RINGERS IV SOLN
INTRAVENOUS | Status: DC
  Administered 2012-01-09: 1000 mL via INTRAVENOUS

## 2012-01-09 MED ORDER — BUPIVACAINE-EPINEPHRINE (PF) 0.5% -1:200000 IJ SOLN
INTRAMUSCULAR | Status: AC
  Filled 2012-01-09: qty 10

## 2012-01-09 MED ORDER — ACETAMINOPHEN 10 MG/ML IV SOLN
INTRAVENOUS | Status: DC | PRN
  Administered 2012-01-09: 1000 mg via INTRAVENOUS

## 2012-01-09 MED ORDER — ONDANSETRON HCL 4 MG/2ML IJ SOLN
4.0000 mg | Freq: Four times a day (QID) | INTRAMUSCULAR | Status: DC | PRN
  Filled 2012-01-09: qty 2

## 2012-01-09 MED ORDER — ACETAMINOPHEN 10 MG/ML IV SOLN
INTRAVENOUS | Status: AC
  Filled 2012-01-09: qty 100

## 2012-01-09 MED ORDER — DEXAMETHASONE SODIUM PHOSPHATE 10 MG/ML IJ SOLN
INTRAMUSCULAR | Status: DC | PRN
  Administered 2012-01-09: 10 mg via INTRAVENOUS

## 2012-01-09 MED ORDER — OXYCODONE HCL 5 MG PO TABS: 5.0000 mg | ORAL_TABLET | Freq: Once | ORAL | Status: DC | PRN

## 2012-01-09 MED ORDER — BUPIVACAINE-EPINEPHRINE PF 0.5-1:200000 % IJ SOLN
INTRAMUSCULAR | Status: DC | PRN
  Administered 2012-01-09: 45 mL

## 2012-01-09 MED ORDER — MORPHINE SULFATE 2 MG/ML IJ SOLN
2.0000 mg | INTRAMUSCULAR | Status: DC | PRN
  Administered 2012-01-09 – 2012-01-11 (×5): 2 mg via INTRAVENOUS
  Administered 2012-01-11: 4 mg via INTRAVENOUS
  Filled 2012-01-09: qty 1
  Filled 2012-01-09: qty 2
  Filled 2012-01-09 (×4): qty 1

## 2012-01-09 MED ORDER — FENTANYL CITRATE 0.05 MG/ML IJ SOLN
INTRAMUSCULAR | Status: DC | PRN
  Administered 2012-01-09 (×5): 50 ug via INTRAVENOUS

## 2012-01-09 MED ORDER — CEFAZOLIN SODIUM-DEXTROSE 2-3 GM-% IV SOLR
INTRAVENOUS | Status: AC
  Filled 2012-01-09: qty 50

## 2012-01-09 MED ORDER — MIDAZOLAM HCL 5 MG/5ML IJ SOLN
INTRAMUSCULAR | Status: DC | PRN
  Administered 2012-01-09: 2 mg via INTRAVENOUS

## 2012-01-09 MED ORDER — HYDROMORPHONE HCL PF 1 MG/ML IJ SOLN
INTRAMUSCULAR | Status: AC
  Filled 2012-01-09: qty 1

## 2012-01-09 MED ORDER — METHYLPHENIDATE HCL ER (OSM) 18 MG PO TBCR
54.0000 mg | EXTENDED_RELEASE_TABLET | Freq: Every day | ORAL | Status: DC
Start: 2012-01-10 — End: 2012-01-12
  Filled 2012-01-09: qty 3

## 2012-01-09 MED ORDER — PROPOFOL 10 MG/ML IV EMUL
INTRAVENOUS | Status: DC | PRN
  Administered 2012-01-09: 250 mg via INTRAVENOUS

## 2012-01-09 MED ORDER — PROMETHAZINE HCL 25 MG/ML IJ SOLN: 6.2500 mg | INTRAMUSCULAR | Status: DC | PRN

## 2012-01-09 MED ORDER — CEFAZOLIN SODIUM-DEXTROSE 2-3 GM-% IV SOLR
2.0000 g | INTRAVENOUS | Status: AC
  Administered 2012-01-09: 2 g via INTRAVENOUS

## 2012-01-09 MED ORDER — MEPERIDINE HCL 50 MG/ML IJ SOLN: 6.2500 mg | INTRAMUSCULAR | Status: DC | PRN

## 2012-01-09 MED ORDER — ROCURONIUM BROMIDE 100 MG/10ML IV SOLN
INTRAVENOUS | Status: DC | PRN
  Administered 2012-01-09: 50 mg via INTRAVENOUS

## 2012-01-09 MED ORDER — OXYCODONE HCL 5 MG/5ML PO SOLN
5.0000 mg | Freq: Once | ORAL | Status: DC | PRN
  Filled 2012-01-09: qty 5

## 2012-01-09 MED ORDER — DEXTROSE IN LACTATED RINGERS 5 % IV SOLN
INTRAVENOUS | Status: DC
  Administered 2012-01-09: 1000 mL via INTRAVENOUS
  Administered 2012-01-10 – 2012-01-12 (×4): via INTRAVENOUS

## 2012-01-09 MED ORDER — GLYCOPYRROLATE 0.2 MG/ML IJ SOLN
INTRAMUSCULAR | Status: DC | PRN
  Administered 2012-01-09: .8 mg via INTRAVENOUS

## 2012-01-09 MED ORDER — ACETAMINOPHEN 10 MG/ML IV SOLN: 1000.0000 mg | Freq: Once | INTRAVENOUS | Status: DC | PRN

## 2012-01-09 MED ORDER — HEPARIN SODIUM (PORCINE) 5000 UNIT/ML IJ SOLN
5000.0000 [IU] | Freq: Once | INTRAMUSCULAR | Status: AC
  Administered 2012-01-09: 5000 [IU] via SUBCUTANEOUS
  Filled 2012-01-09: qty 1

## 2012-01-09 MED ORDER — ONDANSETRON HCL 4 MG/2ML IJ SOLN
INTRAMUSCULAR | Status: DC | PRN
  Administered 2012-01-09: 4 mg via INTRAVENOUS

## 2012-01-09 MED ORDER — NEOSTIGMINE METHYLSULFATE 1 MG/ML IJ SOLN
INTRAMUSCULAR | Status: DC | PRN
  Administered 2012-01-09: 5 mg via INTRAVENOUS

## 2012-01-09 MED ORDER — HEPARIN SODIUM (PORCINE) 5000 UNIT/ML IJ SOLN
5000.0000 [IU] | Freq: Three times a day (TID) | INTRAMUSCULAR | Status: DC
  Administered 2012-01-09 – 2012-01-12 (×8): 5000 [IU] via SUBCUTANEOUS
  Filled 2012-01-09 (×11): qty 1

## 2012-01-09 MED ORDER — OXYCODONE-ACETAMINOPHEN 5-325 MG PO TABS
1.0000 | ORAL_TABLET | ORAL | Status: DC | PRN
  Administered 2012-01-09 – 2012-01-12 (×10): 2 via ORAL
  Filled 2012-01-09 (×10): qty 2

## 2012-01-09 SURGICAL SUPPLY — 57 items
ADH SKN CLS APL DERMABOND .7 (GAUZE/BANDAGES/DRESSINGS)
APL SKNCLS STERI-STRIP NONHPOA (GAUZE/BANDAGES/DRESSINGS)
APPLIER CLIP 5 13 M/L LIGAMAX5 (MISCELLANEOUS)
APR CLP MED LRG 5 ANG JAW (MISCELLANEOUS)
BENZOIN TINCTURE PRP APPL 2/3 (GAUZE/BANDAGES/DRESSINGS) IMPLANT
BINDER ABD UNIV 12 45-62 (WOUND CARE) IMPLANT
BINDER ABDOMINAL 46IN 62IN (WOUND CARE)
CANISTER SUCTION 2500CC (MISCELLANEOUS) ×2 IMPLANT
CHLORAPREP W/TINT 26ML (MISCELLANEOUS) ×2 IMPLANT
CLIP APPLIE 5 13 M/L LIGAMAX5 (MISCELLANEOUS) IMPLANT
CLOTH BEACON ORANGE TIMEOUT ST (SAFETY) ×2 IMPLANT
COVER SURGICAL LIGHT HANDLE (MISCELLANEOUS) ×2 IMPLANT
DECANTER SPIKE VIAL GLASS SM (MISCELLANEOUS) ×2 IMPLANT
DERMABOND ADVANCED (GAUZE/BANDAGES/DRESSINGS)
DERMABOND ADVANCED .7 DNX12 (GAUZE/BANDAGES/DRESSINGS) IMPLANT
DEVICE SECURE STRAP 25 ABSORB (INSTRUMENTS) ×1 IMPLANT
DEVICE TROCAR PUNCTURE CLOSURE (ENDOMECHANICALS) IMPLANT
DISSECTOR BLUNT TIP ENDO 5MM (MISCELLANEOUS) ×1 IMPLANT
DRAPE LAPAROSCOPIC ABDOMINAL (DRAPES) ×2 IMPLANT
DRAPE UTILITY XL STRL (DRAPES) ×2 IMPLANT
ELECT REM PT RETURN 9FT ADLT (ELECTROSURGICAL) ×2
ELECTRODE REM PT RTRN 9FT ADLT (ELECTROSURGICAL) ×1 IMPLANT
GLOVE BIOGEL PI IND STRL 7.0 (GLOVE) ×1 IMPLANT
GLOVE BIOGEL PI INDICATOR 7.0 (GLOVE) ×1
GLOVE SURG SS PI 7.5 STRL IVOR (GLOVE) ×2 IMPLANT
GOWN STRL NON-REIN LRG LVL3 (GOWN DISPOSABLE) ×2 IMPLANT
GOWN STRL REIN XL XLG (GOWN DISPOSABLE) ×4 IMPLANT
KIT BASIN OR (CUSTOM PROCEDURE TRAY) ×2 IMPLANT
MESH VENTRALIGHT ST 4.5IN (Mesh General) ×1 IMPLANT
NDL SPNL 22GX3.5 QUINCKE BK (NEEDLE) ×1 IMPLANT
NEEDLE SPNL 22GX3.5 QUINCKE BK (NEEDLE) ×2 IMPLANT
NS IRRIG 1000ML POUR BTL (IV SOLUTION) ×2 IMPLANT
PEN SKIN MARKING BROAD (MISCELLANEOUS) ×2 IMPLANT
PENCIL BUTTON HOLSTER BLD 10FT (ELECTRODE) IMPLANT
SCALPEL HARMONIC ACE (MISCELLANEOUS) IMPLANT
SCISSORS LAP 5X35 DISP (ENDOMECHANICALS) IMPLANT
SET IRRIG TUBING LAPAROSCOPIC (IRRIGATION / IRRIGATOR) IMPLANT
SLEEVE ADV FIXATION 5X100MM (TROCAR) IMPLANT
SLEEVE Z-THREAD 5X100MM (TROCAR) IMPLANT
SOLUTION ANTI FOG 6CC (MISCELLANEOUS) ×2 IMPLANT
STRIP CLOSURE SKIN 1/2X4 (GAUZE/BANDAGES/DRESSINGS) IMPLANT
SUT MNCRL AB 4-0 PS2 18 (SUTURE) ×2 IMPLANT
SUT NOVA NAB GS-21 0 18 T12 DT (SUTURE) ×2 IMPLANT
SUT PROLENE 0 CT 1 CR/8 (SUTURE) IMPLANT
TACKER 5MM HERNIA 3.5CML NAB (ENDOMECHANICALS) ×1 IMPLANT
TOWEL OR 17X26 10 PK STRL BLUE (TOWEL DISPOSABLE) ×2 IMPLANT
TRAY FOLEY CATH 14FRSI W/METER (CATHETERS) IMPLANT
TRAY LAP CHOLE (CUSTOM PROCEDURE TRAY) ×2 IMPLANT
TROCAR ADV FIXATION 11X100MM (TROCAR) IMPLANT
TROCAR ADV FIXATION 5X100MM (TROCAR) IMPLANT
TROCAR BLADELESS OPT 5 75 (ENDOMECHANICALS) ×1 IMPLANT
TROCAR XCEL NON-BLD 11X100MML (ENDOMECHANICALS) IMPLANT
TROCAR Z-THREAD FIOS 11X100 BL (TROCAR) ×1 IMPLANT
TROCAR Z-THREAD FIOS 5X100MM (TROCAR) ×2 IMPLANT
TROCAR Z-THREAD SLEEVE 11X100 (TROCAR) IMPLANT
TUBING INSUFFLATION 10FT LAP (TUBING) ×2 IMPLANT
VENTRALIGHT MESH ST IMPLANT

## 2012-01-09 NOTE — H&P (View-Only) (Signed)
Subjective:   abdominal pain, hernia  Patient ID: [name], female   DOB: 09/07/1965, 46 y.o.   MRN: [id number]  HPI Patient is a 46-year-old female referred through the courtesy of Dr. Golding for apparent abdominal hernia.about 4 months ago the patient had a brief episode of fairly severe pain at her umbilicus. This resolved. She did however had a second episode of quite severe pain at her umbilicus and on lying down she was able to see and feel a lump in her abdomen just above the umbilicus about the size of a "goose egg". She has not had recurrent symptoms since that time. The patient saw Dr. Golding for this and was referredto Dr. Rehman history is also having some red blood on her toilet tissue. The patient was evaluated and a CT scan of the abdomen was obtained. I've reviewed this study and this shows a fat-containing anterior abdominal wall hernia just above the umbilicus, moderate size. There was also a tiny renal calculus and probable corpus luteum cyst. The patient is referred for evaluation. She has not had nausea or vomiting. She has had laparoscopic assisted hysterectomy as well as laparoscopic tubal ligation.  Past Medical History  Diagnosis Date  . ADD (attention deficit disorder)    Past Surgical History  Procedure Date  . Cesarean section   . Partial hysterectomy   . Tubal ligation    Current Outpatient Prescriptions  Medication Sig Dispense Refill  . beta carotene w/minerals (OCUVITE) tablet Take 1 tablet by mouth daily.      . methylphenidate (CONCERTA) 36 MG CR tablet Take 36 mg by mouth every morning.       No Known Allergies History  Substance Use Topics  . Smoking status: Never Smoker   . Smokeless tobacco: Not on file  . Alcohol Use: Yes     rare occasion     Review of Systems  Respiratory: Negative.   Cardiovascular: Negative.   Gastrointestinal: Positive for abdominal pain and anal bleeding. Negative for nausea and vomiting.       Objective:   Physical Exam General: Obese otherwise well-appearing Caucasian female Skin: Warm and dry HEENT: There is a palpable goiter greater on the left. Sclera nonicteric. No other masses. Lymph nodes: No cervical, supraclavicular, inguinal nodes palpable Lungs: Clear equal breath sounds without increased work of breathing Cardiac: Regular rate and rhythm. Peripheral pulses intact. No edema. Abdomen. Obese. Healed Pfannenstiel incision. I cannot see a definite trocar site in the periumbilical area. With the patient standing and straining there is a definite palpable reducible tender hernia presenting just above the umbilicus. With the patient lying there is some mild diffuse lower abdominal tenderness. No masses or organomegaly. Extremities: No edema Neurologic: Alert and oriented affect normal    Assessment:     Symptomatic abdominal wall hernia, possible incisional trocar site hernia versus primary supraumbilical hernia. Particularly with the patient's weight I think that a laparoscopic approach would be best. I discussed this with her including the indications for the surgery and risks of anesthetic complications, bleeding, infection, visceral injury, and recurrence. She understands and wants to proceed with repair.    Plan:     Laparoscopic repair of ventral hernia under general anesthesia with overnight hospitalization.      

## 2012-01-09 NOTE — Anesthesia Postprocedure Evaluation (Signed)
Anesthesia Post Note  Patient: [name]  Procedure(s) Performed: Procedure(s) (LRB): LAPAROSCOPIC VENTRAL HERNIA (N/A)  Anesthesia type: General  Patient location: PACU  Post pain: Pain level controlled  Post assessment: Post-op Vital signs reviewed  Last Vitals: BP 142/77  Pulse 68  Temp 36.9 C (Oral)  Resp 6  SpO2 100%  Post vital signs: Reviewed  Level of consciousness: sedated  Complications: No apparent anesthesia complications

## 2012-01-09 NOTE — Preoperative (Signed)
Beta Blockers   Reason not to administer Beta Blockers:Not Applicable 

## 2012-01-09 NOTE — Transfer of Care (Signed)
Immediate Anesthesia Transfer of Care Note  Patient: [name]  Procedure(s) Performed: Procedure(s) (LRB): LAPAROSCOPIC VENTRAL HERNIA (N/A)  Patient Location: PACU  Anesthesia Type: General  Level of Consciousness: awake, alert , oriented and patient cooperative  Airway & Oxygen Therapy: Patient Spontanous Breathing and Patient connected to face mask oxygen  Post-op Assessment: Report given to PACU RN, Post -op Vital signs reviewed and stable and Patient moving all extremities  Post vital signs: Reviewed and stable  Complications: No apparent anesthesia complications

## 2012-01-09 NOTE — Op Note (Signed)
Preoperative Diagnosis: VENTRAL HERNIA  Postoprative Diagnosis: VENTRAL HERNIA  Procedure: Procedure(s): LAPAROSCOPIC VENTRAL HERNIA REPAIR   Surgeon: Glenna Fellows T   Assistants: None  Anesthesia:  General endotracheal anesthesiaDiagnos  Indications:   Patient is a 46 year old female with a previous history of laparoscopic-assisted hysterectomy. She now presents with an enlarging symptomatic hernia at her umbilical port site. Exam reveals a 3-4 cm hernia mass that is tender and reducible. After discussion of options we have elected to proceed with laparoscopic repair particularly in light of her obesity. We have discussed the indications for the surgery and risks including anesthetic complications, bleeding, infection, visceral injury, recurrence, chronic pain. She understands and agrees to proceed.  Procedure Detail:  Patient is brought to the operating room, placed in the supine position on the operating table, and general endotracheal anesthesia induced. She received preoperative IV antibiotics. PAS were in place. Patient timeout was performed and correct procedure verified. Access was obtained with a 5 mm Optiview trocar in the left upper quadrant without difficulty and there was no evidence of trocar injury. Under direct vision an additional 5 mm trocar was placed in the left lateral abdomen and an 11 mm trocar in the left lower quadrant. There was chronically incarcerated omentum in the hernia defect which was just at or above the umbilicus. Using sharp and cautery and blunt dissection the omentum was completely reduced down out of the hernia sac. There was no bowel involvement. The hernia defect measured about 2-1/2 cm in diameter. I chose an 11.5 cm piece of Bard Seprafilm coated mesh. 8 0 Novafil stay sutures were placed around the periphery. After measuring internally for wide deployment of the mesh a corresponding stab incisions were made on the anterior abdominal wall. The mesh  was coiled and introduced into the abdomen and unfurled with the Seprafilm side down. The suture grasper was then used to retrieve the sutures up through the stab wounds and these were tied in place with nice broad taut deployment of the mesh. The Securestrap tacker was then used to further secure the mesh with 2 concentric rows. The abdomen was inspected for hemostasis or evidence of injury and everything looked fine. Trochars were removed and CO2 evacuated. Skin incisions were closed with subcuticular Monocryl and Dermabond. Sponge needle and instrument counts were correct   Estimated Blood Loss:  Minimal         Drains: none  Blood Given: none          Specimens: none        Complications:  * No complications entered in OR log *         Disposition: PACU - hemodynamically stable.         Condition: stable  Mariella Saa MD, FACS  01/09/2012, 2:09 PM

## 2012-01-09 NOTE — Anesthesia Preprocedure Evaluation (Addendum)
Anesthesia Evaluation  Patient identified by MRN, date of birth, ID band Patient awake    Reviewed: Allergy & Precautions, H&P , NPO status , Patient's Chart, lab work & pertinent test results  History of Anesthesia Complications (+) AWARENESS UNDER ANESTHESIA  Airway Mallampati: II TM Distance: >3 FB Neck ROM: Full    Dental  (+) Teeth Intact and Dental Advisory Given   Pulmonary sleep apnea and Continuous Positive Airway Pressure Ventilation ,  breath sounds clear to auscultation  Pulmonary exam normal       Cardiovascular negative cardio ROS  Rhythm:Regular Rate:Normal     Neuro/Psych    GI/Hepatic negative GI ROS, Neg liver ROS,   Endo/Other  negative endocrine ROS  Renal/GU negative Renal ROS     Musculoskeletal negative musculoskeletal ROS (+)   Abdominal (+) + obese,   Peds  Hematology negative hematology ROS (+)   Anesthesia Other Findings   Reproductive/Obstetrics                          Anesthesia Physical Anesthesia Plan  ASA: II  Anesthesia Plan: General   Post-op Pain Management:    Induction: Intravenous  Airway Management Planned: Oral ETT  Additional Equipment:   Intra-op Plan:   Post-operative Plan: Extubation in OR  Informed Consent: I have reviewed the patients History and Physical, chart, labs and discussed the procedure including the risks, benefits and alternatives for the proposed anesthesia with the patient or authorized representative who has indicated his/her understanding and acceptance.   Dental advisory given  Plan Discussed with: CRNA and Surgeon  Anesthesia Plan Comments:         Anesthesia Quick Evaluation

## 2012-01-09 NOTE — Addendum Note (Signed)
Addendum  created 01/09/12 1453 by Gaylan Gerold, MD   Modules edited:Orders, PRL Based Order Sets

## 2012-01-09 NOTE — Interval H&P Note (Signed)
History and Physical Interval Note:  01/09/2012 11:58 AM  [name]  has presented today for surgery, with the diagnosis of ventral hernia  The various methods of treatment have been discussed with the patient and family. After consideration of risks, benefits and other options for treatment, the patient has consented to  Procedure(s) (LRB): LAPAROSCOPIC VENTRAL HERNIA (N/A) as a surgical intervention .  The patient's history has been reviewed, patient examined, no change in status, stable for surgery.  I have reviewed the patient's chart and labs.  Questions were answered to the patient's satisfaction.     Aidaly Cordner T

## 2012-01-10 ENCOUNTER — Encounter (HOSPITAL_COMMUNITY): Payer: Self-pay | Admitting: General Surgery

## 2012-01-10 MED ORDER — DIPHENHYDRAMINE HCL 25 MG PO CAPS
25.0000 mg | ORAL_CAPSULE | ORAL | Status: DC | PRN
  Administered 2012-01-10 – 2012-01-12 (×2): 25 mg via ORAL
  Filled 2012-01-10 (×2): qty 1

## 2012-01-10 NOTE — Care Management Note (Signed)
    Page 1 of 1   01/12/2012     10:37:23 AM   CARE MANAGEMENT NOTE 01/12/2012  Patient:  [name], [name]   Account Number:  [id number]  Date Initiated:  01/10/2012  Documentation initiated by:  Lorenda Ishihara  Subjective/Objective Assessment:   46 yo female admitted s/p hernia repair. PTA lived at home with spouse     Action/Plan:   Anticipated DC Date:  01/12/2012   Anticipated DC Plan:  HOME/SELF CARE      DC Planning Services  CM consult      Choice offered to / List presented to:             Status of service:  Completed, signed off Medicare Important Message given?   (If response is "NO", the following Medicare IM given date fields will be blank) Date Medicare IM given:   Date Additional Medicare IM given:    Discharge Disposition:  HOME/SELF CARE  Per UR Regulation:  Reviewed for med. necessity/level of care/duration of stay  If discussed at Long Length of Stay Meetings, dates discussed:    Comments:

## 2012-01-10 NOTE — Progress Notes (Signed)
Patient ID: [name], female   DOB: 1965-11-15, 46 y.o.   MRN: [id number] 1 Day Post-Op  Subjective: A lot of pain when up, OK at rest  Objective: Vital signs in last 24 hours: Temp:  [97 F (36.1 C)-99 F (37.2 C)] 97.9 F (36.6 C) (08/14 0515) Pulse Rate:  [49-68] 52  (08/14 0515) Resp:  [6-20] 16  (08/14 0515) BP: (94-142)/(52-84) 115/65 mmHg (08/14 0515) SpO2:  [96 %-100 %] 100 % (08/14 0515) Weight:  [240 lb (108.863 kg)] 240 lb (108.863 kg) (08/13 1757) Last BM Date: 01/08/12  Intake/Output from previous day: 08/13 0701 - 08/14 0700 In: 2403.8 [I.V.:2403.8] Out: 2615 [Urine:2600; Blood:15] Intake/Output this shift:    General appearance: alert and no distress GI: abnormal findings:  mild tenderness in the entire abdomen Incision/Wound: Clean and dry  Lab Results:  No results found for this basename: WBC:2,HGB:2,HCT:2,PLT:2 in the last 72 hours BMET No results found for this basename: NA:2,K:2,CL:2,CO2:2,GLUCOSE:2,BUN:2,CREATININE:2,CALCIUM:2 in the last 72 hours   Studies/Results: No results found.  Anti-infectives: Anti-infectives     Start     Dose/Rate Route Frequency Ordered Stop   01/09/12 0944   ceFAZolin (ANCEF) IVPB 2 g/50 mL premix        2 g 100 mL/hr over 30 Minutes Intravenous 60 min pre-op 01/09/12 0944 01/09/12 1206          Assessment/Plan: s/p Procedure(s): LAPAROSCOPIC VENTRAL HERNIA Stable but still requiring IV meds and not getting up well Cont current Rx, prob home in AM   LOS: 1 day    Xoie Kreuser T 01/10/2012

## 2012-01-11 LAB — CBC
HCT: 35 % — ABNORMAL LOW (ref 36.0–46.0)
Hemoglobin: 11.7 g/dL — ABNORMAL LOW (ref 12.0–15.0)
MCHC: 33.4 g/dL (ref 30.0–36.0)
RBC: 3.97 MIL/uL (ref 3.87–5.11)
WBC: 9.3 10*3/uL (ref 4.0–10.5)

## 2012-01-11 NOTE — Progress Notes (Signed)
Patient ID: [name], female   DOB: [birth year], 46 y.o.   MRN: [id number] 2 Days Post-Op  Subjective: A lot of pain last night when getting in and out of bed. She is okay when lying still. No nausea or vomiting. Still requiring IV pain medication and does not feel she would be able to cope at home.  Objective: Vital signs in last 24 hours: Temp:  [98 F (36.7 C)-98.2 F (36.8 C)] 98.1 F (36.7 C) (08/15 0541) Pulse Rate:  [54-64] 62  (08/15 0541) Resp:  [16-18] 16  (08/15 0541) BP: (104-149)/(60-64) 104/60 mmHg (08/15 0541) SpO2:  [97 %-99 %] 99 % (08/15 0541) Last BM Date: 01/08/12  Intake/Output from previous day: 08/14 0701 - 08/15 0700 In: 1787.5 [I.V.:1787.5] Out: 1250 [Urine:1250] Intake/Output this shift:    General appearance: alert and no distress GI: mild tenderness around the incisions and operative site Incision/Wound: no drainage or erythema or other evidence of infection  Lab Results:  No results found for this basename: WBC:2,HGB:2,HCT:2,PLT:2 in the last 72 hours BMET No results found for this basename: NA:2,K:2,CL:2,CO2:2,GLUCOSE:2,BUN:2,CREATININE:2,CALCIUM:2 in the last 72 hours   Studies/Results: No results found.  Anti-infectives: Anti-infectives     Start     Dose/Rate Route Frequency Ordered Stop   01/09/12 0944   ceFAZolin (ANCEF) IVPB 2 g/50 mL premix        2 g 100 mL/hr over 30 Minutes Intravenous 60 min pre-op 01/09/12 0944 01/09/12 1206          Assessment/Plan: s/p Procedure(s): LAPAROSCOPIC VENTRAL HERNIA I don't see evidence of complication but she still requiring IV pain medication and not mobile enough for discharge. We will check CBC.   LOS: 2 days    Kaoru Rezendes T 01/11/2012

## 2012-01-12 MED ORDER — OXYCODONE-ACETAMINOPHEN 5-325 MG PO TABS: 1.0000 | ORAL_TABLET | ORAL | Status: AC | PRN

## 2012-01-12 NOTE — Discharge Summary (Signed)
   Patient ID: [name] [id number] 46 y.o. Oct 26, 1965  01/09/2012  Discharge date and time: 01/12/2012   Admitting Physician: Glenna Fellows T  Discharge Physician: Glenna Fellows T  Admission Diagnoses: VENTRAL HERNIA  Discharge Diagnoses: Same  Operations: Procedure(s): LAPAROSCOPIC VENTRAL HERNIA  Admission Condition: good  Discharged Condition: good  Indication for Admission: patient is a 46 year old female who presents with an enlarging ventral hernia. Umbilically history of laparoscopic GYN surgery. She is electively admitted for laparoscopic repair.  Hospital Course: the patient underwent an uneventful laparoscopic repair of approximately 2 cm hernia on the morning of admission. Postoperatively she had quite a bit of incisional pain for the first 2 days. This limited her mobility and she was observed in the hospital and required occasional IV pain medicine. She had no GI difficulties. By the third postoperative day her pain was dramatically improved. She was getting in and out of bed without difficulty. She is afebrile with normal vital signs. Abdomen is soft and nontender. Incisions are all clean and dry and healing well.   Disposition: Home  Patient Instructions:   Zafiro, Routson  Home Medication Instructions [id number]   Printed on:01/12/12 0755  Medication Information                    OVER THE COUNTER MEDICATION Take 1 tablet by mouth 2 (two) times daily. Ultra woman multivitamin           methylphenidate (CONCERTA) 54 MG CR tablet Take 54 mg by mouth every morning.           Iodine, Kelp, 0.15 MG TABS Take by mouth every morning.           oxyCODONE-acetaminophen (PERCOCET/ROXICET) 5-325 MG per tablet Take 1-2 tablets by mouth every 4 (four) hours as needed for pain.             Activity: no heavy lifting for 4 weeks Diet: regular diet Wound Care: none needed  Follow-up:  With Dr. Johna Sheriff in 3 weeks.  Signed: Mariella Saa MD, FACS  01/12/2012, 7:55 AM

## 2012-02-02 ENCOUNTER — Ambulatory Visit (INDEPENDENT_AMBULATORY_CARE_PROVIDER_SITE_OTHER): Payer: BC Managed Care – PPO | Admitting: General Surgery

## 2012-02-02 ENCOUNTER — Encounter (INDEPENDENT_AMBULATORY_CARE_PROVIDER_SITE_OTHER): Payer: Self-pay | Admitting: General Surgery

## 2012-02-02 VITALS — BP 128/82 | HR 71 | Temp 98.4°F | Resp 16 | Ht 63.0 in | Wt 246.4 lb

## 2012-02-02 DIAGNOSIS — Z09 Encounter for follow-up examination after completed treatment for conditions other than malignant neoplasm: Secondary | ICD-10-CM

## 2012-02-02 NOTE — Patient Instructions (Addendum)
No heavy lifting or straining for 2 weeks and then unrestricted activity

## 2012-02-02 NOTE — Progress Notes (Signed)
History: Patient returns 4 weeks following laparoscopic repair of her surgical hernia post laparoscopic hysterectomy. She reports only mild pain initially which now has resolved. She will occasionally feel a tiny bit of sticking when she twisted. She is happy with the result.  Exam: General: Appears well Abdomen: Incisions are all well healed. Nontender. The hernia repair feels solid.  Assessment and plan: Doing well following laparoscopic hernia repair. She is to return to work next week with no heavy lifting for the first 2 weeks. Return here as needed.

## 2012-02-16 ENCOUNTER — Encounter (INDEPENDENT_AMBULATORY_CARE_PROVIDER_SITE_OTHER): Payer: BC Managed Care – PPO | Admitting: General Surgery

## 2014-01-20 ENCOUNTER — Emergency Department (HOSPITAL_COMMUNITY): Payer: BC Managed Care – PPO

## 2014-01-20 ENCOUNTER — Encounter (HOSPITAL_COMMUNITY): Payer: Self-pay | Admitting: Emergency Medicine

## 2014-01-20 ENCOUNTER — Emergency Department (HOSPITAL_COMMUNITY)
Admission: EM | Admit: 2014-01-20 | Discharge: 2014-01-20 | Disposition: A | Discharge status: Home / Self Care | Payer: BC Managed Care – PPO | Attending: Emergency Medicine | Admitting: Emergency Medicine

## 2014-01-20 DIAGNOSIS — Z9981 Dependence on supplemental oxygen: Secondary | ICD-10-CM | POA: Insufficient documentation

## 2014-01-20 DIAGNOSIS — F988 Other specified behavioral and emotional disorders with onset usually occurring in childhood and adolescence: Secondary | ICD-10-CM | POA: Insufficient documentation

## 2014-01-20 DIAGNOSIS — Z87891 Personal history of nicotine dependence: Secondary | ICD-10-CM | POA: Insufficient documentation

## 2014-01-20 DIAGNOSIS — Z79899 Other long term (current) drug therapy: Secondary | ICD-10-CM | POA: Insufficient documentation

## 2014-01-20 DIAGNOSIS — R109 Unspecified abdominal pain: Secondary | ICD-10-CM

## 2014-01-20 DIAGNOSIS — R1012 Left upper quadrant pain: Secondary | ICD-10-CM | POA: Diagnosis not present

## 2014-01-20 DIAGNOSIS — G478 Other sleep disorders: Secondary | ICD-10-CM | POA: Diagnosis not present

## 2014-01-20 LAB — CBC WITH DIFFERENTIAL/PLATELET
Basophils Absolute: 0 K/uL (ref 0.0–0.1)
Basophils Relative: 0 % (ref 0–1)
Eosinophils Absolute: 0.1 K/uL (ref 0.0–0.7)
Eosinophils Relative: 1 % (ref 0–5)
HCT: 40.6 % (ref 36.0–46.0)
Hemoglobin: 13.4 g/dL (ref 12.0–15.0)
Lymphocytes Relative: 25 % (ref 12–46)
Lymphs Abs: 2.7 K/uL (ref 0.7–4.0)
MCH: 28 pg (ref 26.0–34.0)
MCHC: 33 g/dL (ref 30.0–36.0)
MCV: 84.8 fL (ref 78.0–100.0)
Monocytes Absolute: 0.7 K/uL (ref 0.1–1.0)
Monocytes Relative: 7 % (ref 3–12)
Neutro Abs: 7.2 K/uL (ref 1.7–7.7)
Neutrophils Relative %: 67 % (ref 43–77)
Platelets: 267 K/uL (ref 150–400)
RBC: 4.79 MIL/uL (ref 3.87–5.11)
RDW: 13.3 % (ref 11.5–15.5)
WBC: 10.8 K/uL — ABNORMAL HIGH (ref 4.0–10.5)

## 2014-01-20 LAB — URINE MICROSCOPIC-ADD ON

## 2014-01-20 LAB — COMPREHENSIVE METABOLIC PANEL
ALBUMIN: 3.8 g/dL (ref 3.5–5.2)
ALT: 16 U/L (ref 0–35)
ANION GAP: 17 — AB (ref 5–15)
AST: 15 U/L (ref 0–37)
Alkaline Phosphatase: 61 U/L (ref 39–117)
BILIRUBIN TOTAL: 0.4 mg/dL (ref 0.3–1.2)
BUN: 14 mg/dL (ref 6–23)
CHLORIDE: 101 meq/L (ref 96–112)
CO2: 23 mEq/L (ref 19–32)
CREATININE: 0.76 mg/dL (ref 0.50–1.10)
Calcium: 9.2 mg/dL (ref 8.4–10.5)
GFR calc Af Amer: >90 mL/min (ref >90)
GFR calc non Af Amer: >90 mL/min (ref >90)
Glucose, Bld: 106 mg/dL — ABNORMAL HIGH (ref 70–99)
Potassium: 4.6 mEq/L (ref 3.7–5.3)
Sodium: 141 mEq/L (ref 137–147)
TOTAL PROTEIN: 7.6 g/dL (ref 6.0–8.3)

## 2014-01-20 LAB — URINALYSIS, ROUTINE W REFLEX MICROSCOPIC
Bilirubin Urine: NEGATIVE
Glucose, UA: NEGATIVE mg/dL
Ketones, ur: NEGATIVE mg/dL
Leukocytes, UA: NEGATIVE
Nitrite: NEGATIVE
Protein, ur: NEGATIVE mg/dL
Specific Gravity, Urine: 1.026 (ref 1.005–1.030)
Urobilinogen, UA: 0.2 mg/dL (ref 0.0–1.0)
pH: 5 (ref 5.0–8.0)

## 2014-01-20 LAB — LIPASE, BLOOD: LIPASE: 23 U/L (ref 11–59)

## 2014-01-20 MED ORDER — IOHEXOL 300 MG/ML  SOLN
50.0000 mL | Freq: Once | INTRAMUSCULAR | Status: AC | PRN
  Administered 2014-01-20: 50 mL via ORAL

## 2014-01-20 MED ORDER — HYDROCODONE-ACETAMINOPHEN 5-325 MG PO TABS: 2.0000 | ORAL_TABLET | ORAL | Status: DC | PRN

## 2014-01-20 MED ORDER — MORPHINE SULFATE 4 MG/ML IJ SOLN
4.0000 mg | Freq: Once | INTRAMUSCULAR | Status: AC
  Administered 2014-01-20: 4 mg via INTRAVENOUS
  Filled 2014-01-20: qty 1

## 2014-01-20 MED ORDER — ONDANSETRON HCL 4 MG/2ML IJ SOLN
4.0000 mg | Freq: Once | INTRAMUSCULAR | Status: AC
  Administered 2014-01-20: 4 mg via INTRAVENOUS
  Filled 2014-01-20: qty 2

## 2014-01-20 MED ORDER — IOHEXOL 300 MG/ML  SOLN
100.0000 mL | Freq: Once | INTRAMUSCULAR | Status: AC | PRN
  Administered 2014-01-20: 100 mL via INTRAVENOUS

## 2014-01-20 MED ORDER — ONDANSETRON HCL 4 MG PO TABS: 4.0000 mg | ORAL_TABLET | Freq: Four times a day (QID) | ORAL | Status: DC

## 2014-01-20 NOTE — ED Notes (Signed)
Pt c/o left abd pain that started last Wed. Pt states that pain doesn't hurt very much until she gets up and moves around.  Pt states that she did have hernia surgery here couple years back.  Pt denies n/v/d.

## 2014-01-20 NOTE — ED Notes (Signed)
CT notified patient is ready for imaging 

## 2014-01-20 NOTE — ED Notes (Signed)
Patient transported to CT 

## 2014-01-20 NOTE — ED Notes (Signed)
Pt c/o LUQ abdominal pain x 1 week. Pt sts the pain is always there, but gets much worse when she moves or has to use the restroom. Area also tender to palpation. Pt had hernia surgery last year and had a large mesh placed. Pt sts she sometimes gets the same pain in the RUQ as well. Pt A&Ox4. Pt ambulatory to room 5.

## 2014-01-20 NOTE — Discharge Instructions (Signed)

## 2014-01-20 NOTE — ED Provider Notes (Signed)
CSN: [id number]     Arrival date & time 01/20/14  1633 History   First MD Initiated Contact with Patient 01/20/14 1819     Chief Complaint  Patient presents with  . Abdominal Pain     (Consider location/radiation/quality/duration/timing/severity/associated sxs/prior Treatment) HPI Comments: Patient complains of a six-day history of left-sided upper abdominal pain that is constant and worse with movement worse with palpation. Denies any falls or trauma. She denies any nausea, vomiting and diarrhea. She denies any fever, chills, urinary or vaginal symptoms. Pain reminds her of when she had her hernia surgery 2 years ago. Pain is now constant. It is worse with palpation and movement. She is able to eat and drink. No change in bowel habits. No chest pain or shortness of breath  The history is provided by the patient.    Past Medical History  Diagnosis Date  . ADD (attention deficit disorder)   . Sleep apnea     no cpap, sleep study 11-24-2010 on chart   Past Surgical History  Procedure Laterality Date  . Cesarean section  1989  . Partial hysterectomy  jan 2012  . Tubal ligation  1992  . Wisdom tooth extraction  2003  . Ventral hernia repair  01/09/2012    Procedure: LAPAROSCOPIC VENTRAL HERNIA;  Surgeon: Mariella Saa, MD;  Location: [facility];  Service: General;  Laterality: N/A;  Laparoscopic Ventral Hernia Repair  . Hernia repair  2013    Lap Ventral Hernia Repair   No family history on file. History  Substance Use Topics  . Smoking status: Former Smoker -- 5 years    Quit date: 05/29/1990  . Smokeless tobacco: Never Used  . Alcohol Use: No     Comment: rare occasion   OB History   Grav Para Term Preterm Abortions TAB SAB Ect Mult Living                 Review of Systems  Constitutional: Negative for activity change and appetite change.  Respiratory: Negative for cough, chest tightness and shortness of breath.   Cardiovascular: Negative for chest pain.   Gastrointestinal: Positive for abdominal pain. Negative for nausea, vomiting and constipation.  Genitourinary: Negative for dysuria, vaginal bleeding and vaginal discharge.  Musculoskeletal: Negative for arthralgias, back pain and myalgias.  Skin: Negative for rash.  Neurological: Negative for dizziness, weakness and headaches.  A complete 10 system review of systems was obtained and all systems are negative except as noted in the HPI and PMH.      Allergies  Review of patient's allergies indicates no known allergies.  Home Medications   Prior to Admission medications   Medication Sig Start Date End Date Taking? Authorizing Provider  methylphenidate (CONCERTA) 54 MG CR tablet Take 54 mg by mouth every morning.   Yes Historical Provider, MD  HYDROcodone-acetaminophen (NORCO/VICODIN) 5-325 MG per tablet Take 2 tablets by mouth every 4 (four) hours as needed. 01/20/14   Glynn Octave, MD  ondansetron (ZOFRAN) 4 MG tablet Take 1 tablet (4 mg total) by mouth every 6 (six) hours. 01/20/14   Glynn Octave, MD   BP 131/61  Pulse 64  Temp(Src) 98.3 F (36.8 C) (Oral)  Resp 18  SpO2 97% Physical Exam  Nursing note and vitals reviewed. Constitutional: She is oriented to person, place, and time. She appears well-developed and well-nourished. No distress.  HENT:  Head: Normocephalic and atraumatic.  Mouth/Throat: Oropharynx is clear and moist. No oropharyngeal exudate.  Eyes: Conjunctivae and EOM are  normal. Pupils are equal, round, and reactive to light.  Neck: Normal range of motion. Neck supple.  No meningismus.  Cardiovascular: Normal rate, regular rhythm, normal heart sounds and intact distal pulses.   No murmur heard. Pulmonary/Chest: Effort normal and breath sounds normal. No respiratory distress.  Abdominal: Soft. There is tenderness. There is no rebound and no guarding.  TTP LUQ pain. No guarding or rebound.  No appreciable hernia  Musculoskeletal: Normal range of motion. She  exhibits no edema and no tenderness.  No CVAT  Neurological: She is alert and oriented to person, place, and time. No cranial nerve deficit. She exhibits normal muscle tone. Coordination normal.  No ataxia on finger to nose bilaterally. No pronator drift. 5/5 strength throughout. CN 2-12 intact. Negative Romberg. Equal grip strength. Sensation intact. Gait is normal.   Skin: Skin is warm.  Psychiatric: She has a normal mood and affect. Her behavior is normal.    ED Course  Procedures (including critical care time) Labs Review Labs Reviewed  CBC WITH DIFFERENTIAL - Abnormal; Notable for the following:    WBC 10.8 (*)    All other components within normal limits  COMPREHENSIVE METABOLIC PANEL - Abnormal; Notable for the following:    Glucose, Bld 106 (*)    Anion gap 17 (*)    All other components within normal limits  URINALYSIS, ROUTINE W REFLEX MICROSCOPIC - Abnormal; Notable for the following:    Color, Urine AMBER (*)    APPearance CLOUDY (*)    Hgb urine dipstick TRACE (*)    All other components within normal limits  URINE MICROSCOPIC-ADD ON - Abnormal; Notable for the following:    Squamous Epithelial / LPF FEW (*)    Bacteria, UA FEW (*)    All other components within normal limits  LIPASE, BLOOD    Imaging Review Ct Abdomen Pelvis W Contrast  01/20/2014   CLINICAL DATA:  Left upper quadrant pain 1 week. History of umbilical hernia.  EXAM: CT ABDOMEN AND PELVIS WITH CONTRAST  TECHNIQUE: Multidetector CT imaging of the abdomen and pelvis was performed using the standard protocol following bolus administration of intravenous contrast.  CONTRAST:  50mL OMNIPAQUE IOHEXOL 300 MG/ML SOLN, OMNIPAQUE IOHEXOL 300 MG/ML SOLN  COMPARISON:  11/27/2011 and 07/13/2008  FINDINGS: Lung bases are within normal.  Abdominal images demonstrate a normal liver, spleen, pancreas, gallbladder and adrenal glands. Kidneys are normal in size without hydronephrosis. There is a 5 mm nonobstructing  stone over the mid pole of the left kidney. There are a few small bilateral renal cortical hypodensities with the largest measuring 1 cm over the upper pole left kidney likely cysts. Ureters are within normal. Appendix is normal. No free fluid or focal inflammation. No evidence of abdominal wall hernia.  Pelvic images demonstrate evidence of a prior partial hysterectomy. Ovaries, bladder and rectum are within normal. Remaining bones and soft tissues are unremarkable.  IMPRESSION: No acute findings in the abdomen/pelvis.  Nonobstructing 5 mm left renal stone.  Small bilateral renal cysts.   Electronically Signed   By: Elberta Fortis M.D.   On: 01/20/2014 21:27     EKG Interpretation None      MDM   Final diagnoses:  Abdominal pain, unspecified abdominal location   Left upper quadrant abdominal pain ongoing for the past week worse with palpation and movement. No appreciable hernia on exam.  Urinalysis negative.Labs at baseline.  CT scan shows no acute findings. No complications from previous surgery. Abdomen soft and  nontender. She's tolerating by mouth. Stable for discharge and follow up with PCP and surgeon.    Glynn Octave, MD 01/20/14 [id number]

## 2014-01-22 ENCOUNTER — Ambulatory Visit (INDEPENDENT_AMBULATORY_CARE_PROVIDER_SITE_OTHER): Payer: BC Managed Care – PPO | Admitting: General Surgery

## 2014-01-22 ENCOUNTER — Encounter (INDEPENDENT_AMBULATORY_CARE_PROVIDER_SITE_OTHER): Payer: Self-pay | Admitting: General Surgery

## 2014-01-22 VITALS — BP 126/78 | HR 77 | Temp 97.5°F | Ht 63.0 in | Wt 258.0 lb

## 2014-01-22 DIAGNOSIS — R1013 Epigastric pain: Secondary | ICD-10-CM

## 2014-01-22 NOTE — Patient Instructions (Signed)
Take Aleve to twice daily with food for the next 2 weeks. Stop if you develop stomach upset or burning. Consider using abdominal binder If pain is not going away after 3-4 weeks please call for a return appointment Call if symptoms are worsening or new symptoms develop. I do not see evidence of a recurrent hernia or surgical problem on your examination or CT scan.

## 2014-01-22 NOTE — Progress Notes (Signed)
Chief complaint abdominal pain  History: Patient returns to the office with a history of laparoscopic repair of trocar site hernia at the umbilicus done in 2013. She had been doing well with no problems. About a week ago she had the onset fairly suddenly of sharp pain just above her umbilicus and somewhat over to the left. This has persisted. It is related to motion such as getting up and down and goes away when she rests. No associated GI symptoms. It got somewhat worse this past weekend and she presented to the emergency department. A CT scan of the abdomen was obtained which I reviewed and this shows no acute abnormalities. Specifically I do not see any evidence of recurrent hernia or other issues around the surgical site in the abdominal wall.  Exam: BP 126/78  Pulse 77  Temp(Src) 97.5 F (36.4 C)  Ht  (1.6 m)  Wt 258 lb (117.028 kg)  BMI 45.71 kg/m2 General: Morbidly obese but otherwise well-appearing. Moving easily up onto and off the exam table Abdomen: No significant tenderness today. With her coughing and straining and doing Valsalva and sit up maneuvers I do not feel any evidence of recurrent hernia.  Assessment and plan: Acute abdominal pain periumbilical. I suspect this may be muscle strain or related to scar tissue around her mesh. I suspect this will resolve. No surgical issues seen. I recommended Naprosyn twice daily for 2 weeks and an abdominal binder. If she develops any worsening symptoms or if she is not clearly improved after 3-4 weeks she will call for return evaluation.

## 2015-01-13 ENCOUNTER — Other Ambulatory Visit (HOSPITAL_COMMUNITY): Payer: Self-pay | Admitting: Internal Medicine

## 2015-01-13 DIAGNOSIS — I7090 Unspecified atherosclerosis: Secondary | ICD-10-CM

## 2015-01-13 DIAGNOSIS — I708 Atherosclerosis of other arteries: Secondary | ICD-10-CM

## 2015-01-14 ENCOUNTER — Other Ambulatory Visit (HOSPITAL_COMMUNITY): Payer: Self-pay | Admitting: Internal Medicine

## 2015-01-15 ENCOUNTER — Ambulatory Visit (HOSPITAL_COMMUNITY)
Admission: RE | Admit: 2015-01-15 | Discharge: 2015-01-15 | Disposition: A | Discharge status: Home / Self Care | Payer: BLUE CROSS/BLUE SHIELD | Source: Ambulatory Visit | Attending: Internal Medicine | Admitting: Internal Medicine

## 2015-01-15 ENCOUNTER — Inpatient Hospital Stay (HOSPITAL_COMMUNITY): Admission: RE | Admit: 2015-01-15 | Payer: Self-pay | Source: Ambulatory Visit

## 2015-01-15 DIAGNOSIS — I7090 Unspecified atherosclerosis: Secondary | ICD-10-CM

## 2015-01-15 DIAGNOSIS — I708 Atherosclerosis of other arteries: Secondary | ICD-10-CM | POA: Insufficient documentation

## 2015-01-15 DIAGNOSIS — K760 Fatty (change of) liver, not elsewhere classified: Secondary | ICD-10-CM | POA: Diagnosis not present

## 2015-01-15 MED ORDER — IOHEXOL 350 MG/ML SOLN
100.0000 mL | Freq: Once | INTRAVENOUS | Status: AC | PRN
  Administered 2015-01-15: 100 mL via INTRAVENOUS

## 2015-01-22 ENCOUNTER — Other Ambulatory Visit (HOSPITAL_COMMUNITY): Payer: Self-pay

## 2015-03-12 ENCOUNTER — Encounter: Payer: Self-pay | Admitting: Internal Medicine

## 2015-03-12 ENCOUNTER — Other Ambulatory Visit (INDEPENDENT_AMBULATORY_CARE_PROVIDER_SITE_OTHER): Payer: BLUE CROSS/BLUE SHIELD

## 2015-03-12 ENCOUNTER — Ambulatory Visit (INDEPENDENT_AMBULATORY_CARE_PROVIDER_SITE_OTHER): Payer: BLUE CROSS/BLUE SHIELD | Admitting: Internal Medicine

## 2015-03-12 VITALS — BP 124/78 | HR 73 | Temp 97.8°F | Resp 12 | Ht 63.0 in | Wt 263.2 lb

## 2015-03-12 DIAGNOSIS — E042 Nontoxic multinodular goiter: Secondary | ICD-10-CM | POA: Diagnosis not present

## 2015-03-12 LAB — T4, FREE: Free T4: 1.02 ng/dL (ref 0.60–1.60)

## 2015-03-12 LAB — TSH: TSH: 1.3 u[IU]/mL (ref 0.35–4.50)

## 2015-03-12 LAB — T3, FREE: T3, Free: 3.2 pg/mL (ref 2.3–4.2)

## 2015-03-12 NOTE — Patient Instructions (Signed)
Please stop at the lab.  Let me know if you develop problems swallowing, choking or shortness of breath.  Please return in 1 year.

## 2015-03-12 NOTE — Progress Notes (Signed)
Patient ID: [name], female   DOB: 08-13-1965, 49 y.o.   MRN: [id number]   HPI  [name] is a 49 y.o.-year-old female, referred by her PCP, Dr. Phillips Odor, for evaluation for multinodular goiter.  She were dx'ed with thyroid nodules >> investigated 2002-2011, then had a recent chest CT scan >> thyroid nodules again. observed.  Reviewed history: 10/29/2000:  FNA of left cold thyroid nodule observed on a nuclear scan showed follicular epithelium and histiocytes           FNA of the isthmic nodule showed colloid and follicular cells including Hurthle cells 04/20/2009: FNA of left 1.5 x 1.1 x 1.4 cm thyroid nodule showed hyperplastic or adenomatous follicular lesion Thyroid U/S (10/29/2009): multiple thyroid nodules, no significant changes from previous ultrasound  Pt denies - + feeling nodules in neck - no hoarseness - no dysphagia - no choking - + Feels the need to clear her voice - + SOB with lying down,  has sleep apnea  No recent thyroid tests available for review.   Pt c/o: - + fatigue - +  Both: heat intolerance/cold intolerance - no tremors - no palpitations - + anxiety/no depression - no hyperdefecation/constipation - no weight loss/weight gain - no dry skin - no hair loss  + FH of thyroid ds.: thyroid surgery in [place]. No FH of thyroid cancer. No h/o radiation tx to head or neck.  No seaweed or kelp. No recent contrast studies (CT Angio 2 mo ago). No steroid use. No herbal supplements. No Biotin supplements or Hair, Skin and Nails vitamins.  ROS: Constitutional: see HPI, + nocturia Eyes: no blurry vision, no xerophthalmia ENT: no sore throat, no nodules palpated in throat, no dysphagia/odynophagia, no hoarseness Cardiovascular: no CP/SOB/palpitations/leg swelling Respiratory: + cough/+ SOB/+ wheezing Gastrointestinal: no N/V/D/C Musculoskeletal: no muscle/joint aches Skin: no rashes Neurological: no tremors/numbness/tingling/dizziness Psychiatric: no  depression/+ anxiety + low libido  Past Medical History  Diagnosis Date  . ADD (attention deficit disorder)   . Sleep apnea     no cpap, sleep study 11-24-2010 on chart   Past Surgical History  Procedure Laterality Date  . Cesarean section  1989  . Partial hysterectomy  jan 2012  . Tubal ligation  1992  . Wisdom tooth extraction  2003  . Ventral hernia repair  01/09/2012    Procedure: LAPAROSCOPIC VENTRAL HERNIA;  Surgeon: Mariella Saa, MD;  Location: [facility];  Service: General;  Laterality: N/A;  Laparoscopic Ventral Hernia Repair  . Hernia repair  2013    Lap Ventral Hernia Repair   Social History   Social History  . Marital Status: Married    Spouse Name: N/A  . Number of Children: 3   Occupational History  . [employer]   Social History Main Topics  . Smoking status: Former Smoker -- 5 years    Quit date: 1996  . Smokeless tobacco: Never Used  . Alcohol Use: No     Comment: rare occasion  . Drug Use: No   Current Outpatient Prescriptions on File Prior to Visit  Medication Sig Dispense Refill  . methylphenidate (CONCERTA) 54 MG CR tablet Take 54 mg by mouth every morning.     No current facility-administered medications on file prior to visit.   No Known Allergies   FH: HTN in father HL in father, mother, sister Heart disease in father Cancer in mother  PE: BP 124/78 mmHg  Pulse 73  Temp(Src) 97.8 F (36.6 C) (Oral)  Resp  12  Ht 5\' 3"  (1.6 m)  Wt 263 lb 3.2 oz (119.387 kg)  BMI 46.64 kg/m2  SpO2 97% Wt Readings from Last 3 Encounters:  03/12/15 263 lb 3.2 oz (119.387 kg)  01/22/14 258 lb (117.028 kg)  02/02/12 246 lb 6.4 oz (111.766 kg)   Constitutional: overweight, in NAD Eyes: PERRLA, EOMI, no exophthalmos ENT: moist mucous membranes, + thyromegaly - lumpy-bumpy thyroidno cervical lymphadenopathy Cardiovascular: RRR, No MRG Respiratory: CTA B Gastrointestinal: abdomen soft, NT, ND, BS+ Musculoskeletal: no deformities, strength intact in  all 4;  Skin: moist, warm, no rashes Neurological: no tremor with outstretched hands, DTR normal in all 4  ASSESSMENT: 1. MNG  PLAN: 1. MNG  - I reviewed the images of her thyroid ultrasound along with the patient. I pointed out that the dominant nodules are not very large, and 2 of them have been biopsied, with apparently benign results Since the biopsies were read before tBethesda criteria were implemented, it is unclear whether the follicular cells seen are benign or FLUS.  Otherwise, the nodules are: - Most hypoechoic - with microcalcifications versus colloid granules - with some internal blood flow - more wide than tall - Fairly well delimited from surrounding tissue Pt does not have a thyroid cancer family history or a personal history of RxTx to head/neck. All these would favor benignity.  - We discussed about repeating the thyroid ultrasound, now, 5 years after her previous ultrasound. Also, depending on the aspect of the nodules, we may need a rebiopsy. She agrees with this. - I explained that this is not cancer, we can continue to follow her on a yearly basis, and check another ultrasound in another year or 2. - she should let me know if she develops significant neck compression symptoms, in that case, we might need to do either lobectomy or thyroidectomy - I'll see her back in a year, assuming her FNA is normal. If FNA abnormal, we will meet sooner.  - We will check a TSH, free T4 and free T3 today - I advised pt to join my chart and I will send her the results through there    Appointment on 03/12/2015  Component Date Value Ref Range Status  . TSH 03/12/2015 1.30  0.35 - 4.50 uIU/mL Final  . Free T4 03/12/2015 1.02  0.60 - 1.60 ng/dL Final  . T3, Free 16/10/960410/14/2016 3.2  2.3 - 4.2 pg/mL Final   TFTs normal.  U/S pending. I will addend the results when they become available.

## 2015-03-15 DIAGNOSIS — E042 Nontoxic multinodular goiter: Secondary | ICD-10-CM | POA: Insufficient documentation

## 2015-10-15 DIAGNOSIS — Z6841 Body Mass Index (BMI) 40.0 and over, adult: Secondary | ICD-10-CM | POA: Diagnosis not present

## 2015-10-15 DIAGNOSIS — R197 Diarrhea, unspecified: Secondary | ICD-10-CM | POA: Diagnosis not present

## 2015-10-15 DIAGNOSIS — R1110 Vomiting, unspecified: Secondary | ICD-10-CM | POA: Diagnosis not present

## 2015-11-09 DIAGNOSIS — E049 Nontoxic goiter, unspecified: Secondary | ICD-10-CM | POA: Diagnosis not present

## 2015-11-12 DIAGNOSIS — Z6841 Body Mass Index (BMI) 40.0 and over, adult: Secondary | ICD-10-CM | POA: Diagnosis not present

## 2015-11-12 DIAGNOSIS — E782 Mixed hyperlipidemia: Secondary | ICD-10-CM | POA: Diagnosis not present

## 2015-11-12 DIAGNOSIS — R41840 Attention and concentration deficit: Secondary | ICD-10-CM | POA: Diagnosis not present

## 2016-01-31 DIAGNOSIS — Z6841 Body Mass Index (BMI) 40.0 and over, adult: Secondary | ICD-10-CM | POA: Diagnosis not present

## 2016-01-31 DIAGNOSIS — J209 Acute bronchitis, unspecified: Secondary | ICD-10-CM | POA: Diagnosis not present

## 2016-02-09 DIAGNOSIS — J209 Acute bronchitis, unspecified: Secondary | ICD-10-CM | POA: Diagnosis not present

## 2016-02-09 DIAGNOSIS — M25521 Pain in right elbow: Secondary | ICD-10-CM | POA: Diagnosis not present

## 2016-02-09 DIAGNOSIS — R41840 Attention and concentration deficit: Secondary | ICD-10-CM | POA: Diagnosis not present

## 2016-02-09 DIAGNOSIS — E782 Mixed hyperlipidemia: Secondary | ICD-10-CM | POA: Diagnosis not present

## 2016-05-26 DIAGNOSIS — R41840 Attention and concentration deficit: Secondary | ICD-10-CM | POA: Diagnosis not present

## 2016-05-26 DIAGNOSIS — E782 Mixed hyperlipidemia: Secondary | ICD-10-CM | POA: Diagnosis not present

## 2016-05-26 DIAGNOSIS — J019 Acute sinusitis, unspecified: Secondary | ICD-10-CM | POA: Diagnosis not present

## 2016-05-26 DIAGNOSIS — Z6841 Body Mass Index (BMI) 40.0 and over, adult: Secondary | ICD-10-CM | POA: Diagnosis not present

## 2016-09-22 DIAGNOSIS — R05 Cough: Secondary | ICD-10-CM | POA: Diagnosis not present

## 2016-09-22 DIAGNOSIS — R41840 Attention and concentration deficit: Secondary | ICD-10-CM | POA: Diagnosis not present

## 2016-09-22 DIAGNOSIS — B351 Tinea unguium: Secondary | ICD-10-CM | POA: Diagnosis not present

## 2016-09-22 DIAGNOSIS — E782 Mixed hyperlipidemia: Secondary | ICD-10-CM | POA: Diagnosis not present

## 2016-10-31 ENCOUNTER — Other Ambulatory Visit (HOSPITAL_COMMUNITY): Payer: Self-pay | Admitting: Physician Assistant

## 2016-10-31 DIAGNOSIS — Z1231 Encounter for screening mammogram for malignant neoplasm of breast: Secondary | ICD-10-CM

## 2016-11-03 ENCOUNTER — Ambulatory Visit (HOSPITAL_COMMUNITY)
Admission: RE | Admit: 2016-11-03 | Discharge: 2016-11-03 | Disposition: A | Discharge status: Home / Self Care | Payer: BLUE CROSS/BLUE SHIELD | Source: Ambulatory Visit | Attending: Physician Assistant | Admitting: Physician Assistant

## 2016-11-03 DIAGNOSIS — Z1231 Encounter for screening mammogram for malignant neoplasm of breast: Secondary | ICD-10-CM | POA: Insufficient documentation

## 2017-01-05 DIAGNOSIS — Z0000 Encounter for general adult medical examination without abnormal findings: Secondary | ICD-10-CM | POA: Diagnosis not present

## 2017-01-05 DIAGNOSIS — Z1389 Encounter for screening for other disorder: Secondary | ICD-10-CM | POA: Diagnosis not present

## 2017-01-05 DIAGNOSIS — Z6841 Body Mass Index (BMI) 40.0 and over, adult: Secondary | ICD-10-CM | POA: Diagnosis not present

## 2017-03-02 DIAGNOSIS — F329 Major depressive disorder, single episode, unspecified: Secondary | ICD-10-CM | POA: Diagnosis not present

## 2017-03-02 DIAGNOSIS — Z6841 Body Mass Index (BMI) 40.0 and over, adult: Secondary | ICD-10-CM | POA: Diagnosis not present

## 2017-03-02 DIAGNOSIS — J019 Acute sinusitis, unspecified: Secondary | ICD-10-CM | POA: Diagnosis not present

## 2017-03-23 DIAGNOSIS — R41840 Attention and concentration deficit: Secondary | ICD-10-CM | POA: Diagnosis not present

## 2017-03-23 DIAGNOSIS — M26601 Right temporomandibular joint disorder, unspecified: Secondary | ICD-10-CM | POA: Diagnosis not present

## 2017-03-23 DIAGNOSIS — Z6841 Body Mass Index (BMI) 40.0 and over, adult: Secondary | ICD-10-CM | POA: Diagnosis not present

## 2017-03-23 DIAGNOSIS — F329 Major depressive disorder, single episode, unspecified: Secondary | ICD-10-CM | POA: Diagnosis not present

## 2017-04-09 DIAGNOSIS — Z6841 Body Mass Index (BMI) 40.0 and over, adult: Secondary | ICD-10-CM | POA: Diagnosis not present

## 2017-04-09 DIAGNOSIS — J209 Acute bronchitis, unspecified: Secondary | ICD-10-CM | POA: Diagnosis not present

## 2017-04-09 DIAGNOSIS — J019 Acute sinusitis, unspecified: Secondary | ICD-10-CM | POA: Diagnosis not present

## 2017-08-02 DIAGNOSIS — E782 Mixed hyperlipidemia: Secondary | ICD-10-CM | POA: Diagnosis not present

## 2017-08-02 DIAGNOSIS — Z6841 Body Mass Index (BMI) 40.0 and over, adult: Secondary | ICD-10-CM | POA: Diagnosis not present

## 2017-08-02 DIAGNOSIS — R5383 Other fatigue: Secondary | ICD-10-CM | POA: Diagnosis not present

## 2017-08-02 DIAGNOSIS — F329 Major depressive disorder, single episode, unspecified: Secondary | ICD-10-CM | POA: Diagnosis not present

## 2017-08-02 DIAGNOSIS — R41840 Attention and concentration deficit: Secondary | ICD-10-CM | POA: Diagnosis not present

## 2017-08-17 DIAGNOSIS — H8111 Benign paroxysmal vertigo, right ear: Secondary | ICD-10-CM | POA: Diagnosis not present

## 2017-08-17 DIAGNOSIS — F329 Major depressive disorder, single episode, unspecified: Secondary | ICD-10-CM | POA: Diagnosis not present

## 2017-08-17 DIAGNOSIS — Z6841 Body Mass Index (BMI) 40.0 and over, adult: Secondary | ICD-10-CM | POA: Diagnosis not present

## 2017-09-16 DIAGNOSIS — J018 Other acute sinusitis: Secondary | ICD-10-CM | POA: Diagnosis not present

## 2017-09-16 DIAGNOSIS — R062 Wheezing: Secondary | ICD-10-CM | POA: Diagnosis not present

## 2017-09-22 DIAGNOSIS — Z6841 Body Mass Index (BMI) 40.0 and over, adult: Secondary | ICD-10-CM | POA: Diagnosis not present

## 2017-09-22 DIAGNOSIS — J45991 Cough variant asthma: Secondary | ICD-10-CM | POA: Diagnosis not present

## 2017-09-22 DIAGNOSIS — R05 Cough: Secondary | ICD-10-CM | POA: Diagnosis not present

## 2017-09-22 DIAGNOSIS — J069 Acute upper respiratory infection, unspecified: Secondary | ICD-10-CM | POA: Diagnosis not present

## 2017-10-03 ENCOUNTER — Other Ambulatory Visit: Payer: Self-pay

## 2017-10-03 ENCOUNTER — Encounter: Payer: Self-pay | Admitting: Allergy and Immunology

## 2017-10-03 ENCOUNTER — Ambulatory Visit (INDEPENDENT_AMBULATORY_CARE_PROVIDER_SITE_OTHER): Payer: BLUE CROSS/BLUE SHIELD | Admitting: Allergy and Immunology

## 2017-10-03 VITALS — BP 130/78 | HR 78 | Temp 97.8°F | Resp 18 | Ht 63.0 in | Wt 272.0 lb

## 2017-10-03 DIAGNOSIS — J3089 Other allergic rhinitis: Secondary | ICD-10-CM | POA: Diagnosis not present

## 2017-10-03 DIAGNOSIS — J329 Chronic sinusitis, unspecified: Secondary | ICD-10-CM | POA: Diagnosis not present

## 2017-10-03 DIAGNOSIS — R062 Wheezing: Secondary | ICD-10-CM | POA: Diagnosis not present

## 2017-10-03 DIAGNOSIS — R05 Cough: Secondary | ICD-10-CM

## 2017-10-03 DIAGNOSIS — R053 Chronic cough: Secondary | ICD-10-CM | POA: Insufficient documentation

## 2017-10-03 MED ORDER — FLUTICASONE PROPIONATE 93 MCG/ACT NA EXHU: 2.0000 | INHALANT_SUSPENSION | Freq: Two times a day (BID) | NASAL | 12 refills | Status: DC

## 2017-10-03 MED ORDER — ALBUTEROL SULFATE HFA 108 (90 BASE) MCG/ACT IN AERS: 2.0000 | INHALATION_SPRAY | RESPIRATORY_TRACT | 3 refills | Status: DC | PRN

## 2017-10-03 MED ORDER — MONTELUKAST SODIUM 10 MG PO TABS: 10.0000 mg | ORAL_TABLET | Freq: Every day | ORAL | 5 refills | Status: DC

## 2017-10-03 MED ORDER — FLUTICASONE PROPIONATE HFA 220 MCG/ACT IN AERO: 2.0000 | INHALATION_SPRAY | Freq: Two times a day (BID) | RESPIRATORY_TRACT | 5 refills | Status: DC

## 2017-10-03 MED ORDER — CARBINOXAMINE MALEATE 4 MG PO TABS: 4.0000 mg | ORAL_TABLET | Freq: Three times a day (TID) | ORAL | 5 refills | Status: DC | PRN

## 2017-10-03 NOTE — Patient Instructions (Addendum)
Cough, persistent The most common causes of chronic cough include the following: upper airway cough syndrome (UACS) which is caused by variety of rhinosinus conditions; asthma; gastroesophageal reflux disease (GERD); chronic bronchitis from cigarette smoking or other inhaled environmental irritants; non-asthmatic eosinophilic bronchitis; and bronchiectasis. In prospective studies, these conditions have accounted for up to 94% of the causes of chronic cough in immunocompetent adults. The history and physical examination suggest that her cough is multifactorial with contribution from postnasal drainage and possible bronchial hyperresponsiveness. We will address these issues at this time.   A prescription has been provided for a flutter valve to be used as needed to break the coughing cycle.  Treatment plan as outlined below.    If symptoms persist or progress despite treatment plan as outlined below, we will proceed with a proton pump inhibitor therapeutic trial to rule out silent reflux as a contributing factor.  Seasonal and perennial allergic rhinitis  Aeroallergen avoidance measures have been discussed and provided in written form.  A prescription has been provided for carbinoxamine 4 mg every 6-8 hours if needed.  A prescription has been provided for [facility], 2 actuations per nostril twice a day. Proper technique has been discussed and demonstrated.  Nasal saline lavage (NeilMed) has been recommended as needed and prior to medicated nasal sprays along with instructions for proper administration.  The risks and benefits of aeroallergen immunotherapy have been discussed. The patient is interested in the possibility of initiating immunotherapy if insurance coverage is favorable. She will let us know how she would like to proceed.  Wheezing/coughing The patients history suggests asthma, however spirometry results today do not meet ATS criteria for that diagnosis.  A prescription has been  provided for montelukast 10 mg daily bedtime.  A prescription has been provided for Flovent 220 g, 2 inhalations twice daily.  To maximize pulmonary deposition, a spacer has been provided along with instructions for its proper administration with an HFA inhaler.  Subjective and objective measures of pulmonary function will be followed and the treatment plan will be adjusted accordingly.  Recurrent respiratory infections Immunocompetence will be assessed with labs.  The following labs have been ordered: CBC with differential, IgG, IgA and IgM, tetanus IgG titers, and pneumococcal IgG titers.  If pre-vaccination titers are low, post-vaccination titers will be drawn to assess response.    The patient will be called with further recommendations and follow-up instructions once the labs have returned.   Return in about 2 months (around 12/03/2017), or if symptoms worsen or fail to improve.   Reducing Pollen Exposure  The American Academy of Allergy, Asthma and Immunology suggests the following steps to reduce your exposure to pollen during allergy seasons.    1. Do not hang sheets or clothing out to dry; pollen may collect on these items. 2. Do not mow lawns or spend time around freshly cut grass; mowing stirs up pollen. 3. Keep windows closed at night.  Keep car windows closed while driving. 4. Minimize morning activities outdoors, a time when pollen counts are usually at their highest. 5. Stay indoors as much as possible when pollen counts or humidity is high and on windy days when pollen tends to remain in the air longer. 6. Use air conditioning when possible.  Many air conditioners have filters that trap the pollen spores. 7. Use a HEPA room air filter to remove pollen form the indoor air you breathe.   Control of House Dust Mite Allergen  House dust mites play a major role  in allergic asthma and rhinitis.  They occur in environments with high humidity wherever human skin, the food for  dust mites is found. High levels have been detected in dust obtained from mattresses, pillows, carpets, upholstered furniture, bed covers, clothes and soft toys.  The principal allergen of the house dust mite is found in its feces.  A gram of dust may contain 1,000 mites and 250,000 fecal particles.  Mite antigen is easily measured in the air during house cleaning activities.    1. Encase mattresses, including the box spring, and pillow, in an air tight cover.  Seal the zipper end of the encased mattresses with wide adhesive tape. 2. Wash the bedding in water of 130 degrees Farenheit weekly.  Avoid cotton comforters/quilts and flannel bedding: the most ideal bed covering is the dacron comforter. 3. Remove all upholstered furniture from the bedroom. 4. Remove carpets, carpet padding, rugs, and non-washable window drapes from the bedroom.  Wash drapes weekly or use plastic window coverings. 5. Remove all non-washable stuffed toys from the bedroom.  Wash stuffed toys weekly. 6. Have the room cleaned frequently with a vacuum cleaner and a damp dust-mop.  The patient should not be in a room which is being cleaned and should wait 1 hour after cleaning before going into the room. 7. Close and seal all heating outlets in the bedroom.  Otherwise, the room will become filled with dust-laden air.  An electric heater can be used to heat the room. 8. Reduce indoor humidity to less than 50%.  Do not use a humidifier.  Control of Dog or Cat Allergen  Avoidance is the best way to manage a dog or cat allergy. If you have a dog or cat and are allergic to dog or cats, consider removing the dog or cat from the home. If you have a dog or cat but don't want to find it a new home, or if your family wants a pet even though someone in the household is allergic, here are some strategies that may help keep symptoms at bay:  1. Keep the pet out of your bedroom and restrict it to only a few rooms. Be advised that keeping the  dog or cat in only one room will not limit the allergens to that room. 2. Don't pet, hug or kiss the dog or cat; if you do, wash your hands with soap and water. 3. High-efficiency particulate air (HEPA) cleaners run continuously in a bedroom or living room can reduce allergen levels over time. 4. Place electrostatic material sheet in the air inlet vent in the bedroom. 5. Regular use of a high-efficiency vacuum cleaner or a central vacuum can reduce allergen levels. 6. Giving your dog or cat a bath at least once a week can reduce airborne allergen.

## 2017-10-03 NOTE — Assessment & Plan Note (Addendum)
The most common causes of chronic cough include the following: upper airway cough syndrome (UACS) which is caused by variety of rhinosinus conditions; asthma; gastroesophageal reflux disease (GERD); chronic bronchitis from cigarette smoking or other inhaled environmental irritants; non-asthmatic eosinophilic bronchitis; and bronchiectasis. In prospective studies, these conditions have accounted for up to 94% of the causes of chronic cough in immunocompetent adults. The history and physical examination suggest that her cough is multifactorial with contribution from postnasal drainage and possible bronchial hyperresponsiveness. We will address these issues at this time.   A prescription has been provided for a flutter valve to be used as needed to break the coughing cycle.  Treatment plan as outlined below.    If symptoms persist or progress despite treatment plan as outlined below, we will proceed with a proton pump inhibitor therapeutic trial to rule out silent reflux as a contributing factor.

## 2017-10-03 NOTE — Assessment & Plan Note (Addendum)
   Aeroallergen avoidance measures have been discussed and provided in written form.  A prescription has been provided for carbinoxamine 4 mg every 6-8 hours if needed.  A prescription has been provided for [facility], 2 actuations per nostril twice a day. Proper technique has been discussed and demonstrated.  Nasal saline lavage (NeilMed) has been recommended as needed and prior to medicated nasal sprays along with instructions for proper administration.  The risks and benefits of aeroallergen immunotherapy have been discussed. The patient is interested in the possibility of initiating immunotherapy if insurance coverage is favorable. She will let us know how she would like to proceed.

## 2017-10-03 NOTE — Progress Notes (Signed)
New Patient Note  RE: [name] MRN: [id number] DOB: 20-Mar-1966 Date of Office Visit: 10/03/2017  Referring provider: Royann Rodriguez, * Primary care provider: Royann Shivers, PA-C  Chief Complaint: Cough; Wheezing; and Sinus Problem   History of present illness: [name] is a 52 y.o. female seen today in consultation requested by Wayland Denis, PA.  She complains of a persistent cough.  This cough is been going on for the past 3 months.  She notes that she typically develops prolonged cough/bronchitis "every spring" and occasionally in the fall.  The cough is described as hacking and nonproductive.  The cough feels like it originates from the upper chest.  The cough is worse when she is laying down at night.  She denies heartburn and water brash.  She also complains of chest tightness and wheezing.  She went to the urgent care on Easter because the cough was so bad and was prescribed a short acting bronchodilator because wheezing was noted on examination.  She reports to have had a chest x-ray approximately 2 weeks ago which was apparently positive for pneumonia, though she denies fevers and chills and the cough did not resolve after a course of Augmentin.  [name] also complains of nasal congestion, thick mucus production, sinus pressure over the cheekbones in between the eyes, ear canal pruritus, and occasional ocular pruritus.  These symptoms occur year-round but are more frequent and severe with pollen exposure.  She states that she typically gets 2 sinus infections per year, in the spring and sometimes in the fall.  She denies recurrent skin infections, urinary tract infections, and GI infections.  Assessment and plan: Cough, persistent The most common causes of chronic cough include the following: upper airway cough syndrome (UACS) which is caused by variety of rhinosinus conditions; asthma; gastroesophageal reflux disease (GERD); chronic bronchitis from  cigarette smoking or other inhaled environmental irritants; non-asthmatic eosinophilic bronchitis; and bronchiectasis. In prospective studies, these conditions have accounted for up to 94% of the causes of chronic cough in immunocompetent adults. The history and physical examination suggest that her cough is multifactorial with contribution from postnasal drainage and possible bronchial hyperresponsiveness. We will address these issues at this time.   A prescription has been provided for a flutter valve to be used as needed to break the coughing cycle.  Treatment plan as outlined below.    If symptoms persist or progress despite treatment plan as outlined below, we will proceed with a proton pump inhibitor therapeutic trial to rule out silent reflux as a contributing factor.  Seasonal and perennial allergic rhinitis  Aeroallergen avoidance measures have been discussed and provided in written form.  A prescription has been provided for carbinoxamine 4 mg every 6-8 hours if needed.  A prescription has been provided for [facility], 2 actuations per nostril twice a day. Proper technique has been discussed and demonstrated.  Nasal saline lavage (NeilMed) has been recommended as needed and prior to medicated nasal sprays along with instructions for proper administration.  The risks and benefits of aeroallergen immunotherapy have been discussed. The patient is interested in the possibility of initiating immunotherapy if insurance coverage is favorable. She will let us know how she would like to proceed.  Wheezing/coughing The patients history suggests asthma, however spirometry results today do not meet ATS criteria for that diagnosis.  A prescription has been provided for montelukast 10 mg daily bedtime.  A prescription has been provided for Flovent 220 g, 2 inhalations twice daily.  To maximize pulmonary deposition, a spacer has been provided along with instructions for its proper administration  with an HFA inhaler.  Subjective and objective measures of pulmonary function will be followed and the treatment plan will be adjusted accordingly.  Recurrent respiratory infections Immunocompetence will be assessed with labs.  The following labs have been ordered: CBC with differential, IgG, IgA and IgM, tetanus IgG titers, and pneumococcal IgG titers.  If pre-vaccination titers are low, post-vaccination titers will be drawn to assess response.    The patient will be called with further recommendations and follow-up instructions once the labs have returned.   Meds ordered this encounter  Medications  . Fluticasone Propionate (XHANCE) 93 MCG/ACT EXHU    Sig: Place 2 puffs into the nose 2 (two) times daily.    Dispense:  32 mL    Refill:  12  . Carbinoxamine Maleate 4 MG TABS    Sig: Take 1 tablet (4 mg total) by mouth every 8 (eight) hours as needed.    Dispense:  30 each    Refill:  5  . montelukast (SINGULAIR) 10 MG tablet    Sig: Take 1 tablet (10 mg total) by mouth at bedtime.    Dispense:  30 tablet    Refill:  5  . fluticasone (FLOVENT HFA) 220 MCG/ACT inhaler    Sig: Inhale 2 puffs into the lungs 2 (two) times daily.    Dispense:  1 Inhaler    Refill:  5  . albuterol (PROAIR HFA) 108 (90 Base) MCG/ACT inhaler    Sig: Inhale 2 puffs into the lungs every 4 (four) hours as needed for wheezing or shortness of breath.    Dispense:  1 Inhaler    Refill:  3    Diagnostics: Spirometry: Spirometry reveals an FVC of 3.03 L and an FEV1 of 2.88 L with 80 mL postbronchodilator improvement.  Please see scanned spirometry results for details. Aeroallergen testing: Positive to grass pollen, tree pollen, cat hair, and dust mite antigen.    Physical examination: Blood pressure 130/78, pulse 78, temperature 97.8 F (36.6 C), temperature source Oral, resp. rate 18, height  (1.6 m), weight 272 lb 0.8 oz (123.4 kg), SpO2 94 %.  General: Alert, interactive, in no acute  distress. HEENT: TMs pearly gray, turbinates moderately edematous with thick discharge, post-pharynx moderately erythematous. Neck: Supple without lymphadenopathy. Lungs: Clear to auscultation without wheezing, rhonchi or rales. CV: Normal S1, S2 without murmurs. Abdomen: Nondistended, nontender. Skin: Warm and dry, without lesions or rashes. Extremities:  No clubbing, cyanosis or edema. Neuro:   Grossly intact.  Review of systems:  Review of systems negative except as noted in HPI / PMHx or noted below: Review of Systems  Constitutional: Negative.   HENT: Negative.   Eyes: Negative.   Respiratory: Negative.   Cardiovascular: Negative.   Gastrointestinal: Negative.   Genitourinary: Negative.   Musculoskeletal: Negative.   Skin: Negative.   Neurological: Negative.   Endo/Heme/Allergies: Negative.   Psychiatric/Behavioral: Negative.     Past medical history:  Past Medical History:  Diagnosis Date  . ADD (attention deficit disorder)   . Sleep apnea    no cpap, sleep study 11-24-2010 on chart    Past surgical history:  Past Surgical History:  Procedure Laterality Date  . CESAREAN SECTION  1989  . HERNIA REPAIR  2013   Lap Ventral Hernia Repair  . PARTIAL HYSTERECTOMY  jan 2012  . TUBAL LIGATION  1992  . VENTRAL HERNIA REPAIR  01/09/2012  Procedure: LAPAROSCOPIC VENTRAL HERNIA;  Surgeon: Mariella Saa, MD;  Location: [facility];  Service: General;  Laterality: N/A;  Laparoscopic Ventral Hernia Repair  . WISDOM TOOTH EXTRACTION  2003    Family history: Family History  Problem Relation Age of Onset  . Asthma Neg Hx   . Allergic rhinitis Neg Hx   . Angioedema Neg Hx   . Eczema Neg Hx   . Immunodeficiency Neg Hx   . Urticaria Neg Hx     Social history: Social History   Socioeconomic History  . Marital status: Married    Spouse name: Not on file  . Number of children: Not on file  . Years of education: Not on file  . Highest education level: Not on file   Occupational History  . Not on file  Social Needs  . Financial resource strain: Not on file  . Food insecurity:    Worry: Not on file    Inability: Not on file  . Transportation needs:    Medical: Not on file    Non-medical: Not on file  Tobacco Use  . Smoking status: Former Smoker    Years: 5.00    Types: Cigarettes    Last attempt to quit: 05/29/1990    Years since quitting: 27.3  . Smokeless tobacco: Never Used  Substance and Sexual Activity  . Alcohol use: No    Comment: rare occasion  . Drug use: No  . Sexual activity: Not on file  Lifestyle  . Physical activity:    Days per week: Not on file    Minutes per session: Not on file  . Stress: Not on file  Relationships  . Social connections:    Talks on phone: Not on file    Gets together: Not on file    Attends religious service: Not on file    Active member of club or organization: Not on file    Attends meetings of clubs or organizations: Not on file    Relationship status: Not on file  . Intimate partner violence:    Fear of current or ex partner: Not on file    Emotionally abused: Not on file    Physically abused: Not on file    Forced sexual activity: Not on file  Other Topics Concern  . Not on file  Social History Narrative  . Not on file   Environmental History: The patient lives in a house with hardwood floors throughout, gas heat, and central air.  There are dogs in the home which have access to her bedroom.  She is a non-smoker.  There is no known mold/water damage in the home.  Allergies as of 10/03/2017   No Known Allergies     Medication List        Accurate as of 10/03/17  1:36 PM. Always use your most recent med list.          amoxicillin-clavulanate 875-125 MG tablet Commonly known as:  AUGMENTIN   Carbinoxamine Maleate 4 MG Tabs Take 1 tablet (4 mg total) by mouth every 8 (eight) hours as needed.   fluticasone 220 MCG/ACT inhaler Commonly known as:  FLOVENT HFA Inhale 2 puffs into the  lungs 2 (two) times daily.   Fluticasone Propionate 93 MCG/ACT Exhu Commonly known as:  XHANCE Place 2 puffs into the nose 2 (two) times daily.   loratadine 10 MG tablet Commonly known as:  CLARITIN Take 10 mg by mouth daily.   methylphenidate 54 MG CR tablet  Commonly known as:  CONCERTA Take 54 mg by mouth every morning.   montelukast 10 MG tablet Commonly known as:  SINGULAIR   montelukast 10 MG tablet Commonly known as:  SINGULAIR Take 1 tablet (10 mg total) by mouth at bedtime.   PROAIR HFA IN Inhale into the lungs.   albuterol 108 (90 Base) MCG/ACT inhaler Commonly known as:  PROAIR HFA Inhale 2 puffs into the lungs every 4 (four) hours as needed for wheezing or shortness of breath.   rosuvastatin 10 MG tablet Commonly known as:  CRESTOR Take 10 mg by mouth daily.       Known medication allergies: No Known Allergies  I appreciate the opportunity to take part in Bennetta's care. Please do not hesitate to contact me with questions.  Sincerely,   R. Jorene Guest, MD

## 2017-10-03 NOTE — Assessment & Plan Note (Signed)
   Immunocompetence will be assessed with labs.  The following labs have been ordered: CBC with differential, IgG, IgA and IgM, tetanus IgG titers, and pneumococcal IgG titers.  If pre-vaccination titers are low, post-vaccination titers will be drawn to assess response.    The patient will be called with further recommendations and follow-up instructions once the labs have returned. 

## 2017-10-03 NOTE — Assessment & Plan Note (Addendum)
The patients history suggests asthma, however spirometry results today do not meet ATS criteria for that diagnosis.  A prescription has been provided for montelukast 10 mg daily bedtime.  A prescription has been provided for Flovent 220 g, 2 inhalations twice daily.  To maximize pulmonary deposition, a spacer has been provided along with instructions for its proper administration with an HFA inhaler.  Subjective and objective measures of pulmonary function will be followed and the treatment plan will be adjusted accordingly.

## 2017-10-04 DIAGNOSIS — J329 Chronic sinusitis, unspecified: Secondary | ICD-10-CM | POA: Diagnosis not present

## 2017-10-08 ENCOUNTER — Other Ambulatory Visit: Payer: Self-pay | Admitting: Allergy

## 2017-10-09 ENCOUNTER — Telehealth: Payer: Self-pay | Admitting: Allergy and Immunology

## 2017-10-09 NOTE — Telephone Encounter (Signed)
Montelukast was sent to both [facility] and [facility]. [facility] prescription has been cancelled.

## 2017-10-09 NOTE — Telephone Encounter (Signed)
[facility] called and said that rx were sent to them and they do not do them.

## 2017-10-10 ENCOUNTER — Other Ambulatory Visit: Payer: Self-pay | Admitting: Allergy

## 2017-10-10 ENCOUNTER — Telehealth: Payer: Self-pay | Admitting: Allergy

## 2017-10-10 LAB — CBC WITH DIFFERENTIAL/PLATELET
Basophils Absolute: 0 10*3/uL (ref 0.0–0.2)
Basos: 0 %
EOS (ABSOLUTE): 0.2 10*3/uL (ref 0.0–0.4)
Eos: 2 %
Hematocrit: 40.9 % (ref 34.0–46.6)
Hemoglobin: 14 g/dL (ref 11.1–15.9)
Immature Grans (Abs): 0 10*3/uL (ref 0.0–0.1)
Immature Granulocytes: 0 %
Lymphocytes Absolute: 2.3 10*3/uL (ref 0.7–3.1)
Lymphs: 21 %
MCH: 28.7 pg (ref 26.6–33.0)
MCHC: 34.2 g/dL (ref 31.5–35.7)
MCV: 84 fL (ref 79–97)
Monocytes Absolute: 0.9 10*3/uL (ref 0.1–0.9)
Monocytes: 8 %
Neutrophils Absolute: 7.7 10*3/uL — ABNORMAL HIGH (ref 1.4–7.0)
Neutrophils: 69 %
Platelets: 292 10*3/uL (ref 150–379)
RBC: 4.87 x10E6/uL (ref 3.77–5.28)
RDW: 14.6 % (ref 12.3–15.4)
WBC: 11.2 10*3/uL — ABNORMAL HIGH (ref 3.4–10.8)

## 2017-10-10 LAB — STREP PNEUMONIAE 23 SEROTYPES IGG
Pneumo Ab Type 1*: 3.9 ug/mL (ref >1.3)
Pneumo Ab Type 12 (12F)*: 0.1 ug/mL — ABNORMAL LOW (ref >1.3)
Pneumo Ab Type 14*: >21.7 ug/mL (ref >1.3)
Pneumo Ab Type 17 (17F)*: 0.4 ug/mL — ABNORMAL LOW (ref >1.3)
Pneumo Ab Type 19 (19F)*: 2.8 ug/mL (ref >1.3)
Pneumo Ab Type 2*: <0.1 ug/mL — ABNORMAL LOW (ref >1.3)
Pneumo Ab Type 20*: 7.9 ug/mL (ref >1.3)
Pneumo Ab Type 22 (22F)*: 0.2 ug/mL — ABNORMAL LOW (ref >1.3)
Pneumo Ab Type 23 (23F)*: 0.8 ug/mL — ABNORMAL LOW (ref >1.3)
Pneumo Ab Type 26 (6B)*: 1.7 ug/mL (ref >1.3)
Pneumo Ab Type 3*: 0.4 ug/mL — ABNORMAL LOW (ref >1.3)
Pneumo Ab Type 34 (10A)*: 1.3 ug/mL — ABNORMAL LOW (ref >1.3)
Pneumo Ab Type 4*: 0.3 ug/mL — ABNORMAL LOW (ref >1.3)
Pneumo Ab Type 43 (11A)*: 3.1 ug/mL (ref >1.3)
Pneumo Ab Type 5*: 0.7 ug/mL — ABNORMAL LOW (ref >1.3)
Pneumo Ab Type 51 (7F)*: 1.3 ug/mL — ABNORMAL LOW (ref >1.3)
Pneumo Ab Type 54 (15B)*: 0.3 ug/mL — ABNORMAL LOW (ref >1.3)
Pneumo Ab Type 56 (18C)*: >14.7 ug/mL (ref >1.3)
Pneumo Ab Type 57 (19A)*: 5 ug/mL (ref >1.3)
Pneumo Ab Type 68 (9V)*: 0.5 ug/mL — ABNORMAL LOW (ref >1.3)
Pneumo Ab Type 70 (33F)*: 3 ug/mL (ref >1.3)
Pneumo Ab Type 8*: 0.4 ug/mL — ABNORMAL LOW (ref >1.3)
Pneumo Ab Type 9 (9N)*: 0.2 ug/mL — ABNORMAL LOW (ref >1.3)

## 2017-10-10 LAB — IGG, IGA, IGM
IgA/Immunoglobulin A, Serum: 286 mg/dL (ref 87–352)
IgG (Immunoglobin G), Serum: 1073 mg/dL (ref 700–1600)
IgM (Immunoglobulin M), Srm: 77 mg/dL (ref 26–217)

## 2017-10-10 LAB — TETANUS ANTIBODY, IGG: Tetanus Ab, IgG: 0.98 IU/mL (ref <0.10)

## 2017-10-10 NOTE — Telephone Encounter (Signed)
Patient called and said she was hurting at her breast area. Informed her she may should call her PCP. Or if it gets to hurting worse go to the ED. Said she thought it could be coming from coughing. Asked about her lab work,informed her the Doctor was waiting for the finall results. As soon as we get them we will call he. She said they were shipping her the xhance.  FYI

## 2017-10-10 NOTE — Telephone Encounter (Signed)
Noted. Thanks.

## 2017-10-10 NOTE — Telephone Encounter (Signed)
Patient called and said she was hurting at her breast area. Informed her she may should call her PCP. Or if it gets to hurting worse go to the ED. Said she thought it could be coming from coughing. Asked about her lab work,informed her the Doctor was waiting for the final results As soon as we get them we will call her. She said they were shipping her  Timmothy Sours to her.

## 2017-10-12 ENCOUNTER — Telehealth: Payer: Self-pay | Admitting: Allergy and Immunology

## 2017-10-12 DIAGNOSIS — J988 Other specified respiratory disorders: Secondary | ICD-10-CM

## 2017-10-12 NOTE — Telephone Encounter (Signed)
Patient said she just spoke to a nurse about test results and she has another question and would like a call back.

## 2017-10-12 NOTE — Progress Notes (Signed)
Pt advised. Will proceed with Pneumovax at PCP office.

## 2017-10-12 NOTE — Telephone Encounter (Signed)
Left message for pt to call back  °

## 2017-10-12 NOTE — Telephone Encounter (Signed)
Spoke with patient regarding medication questions. Understanding verbalized

## 2017-10-16 DIAGNOSIS — Z23 Encounter for immunization: Secondary | ICD-10-CM | POA: Diagnosis not present

## 2017-10-17 ENCOUNTER — Telehealth: Payer: Self-pay | Admitting: *Deleted

## 2017-10-17 MED ORDER — PNEUMOCOCCAL VAC POLYVALENT 25 MCG/0.5ML IJ INJ: 0.5000 mL | INJECTION | Freq: Once | INTRAMUSCULAR | 0 refills | Status: AC

## 2017-10-17 NOTE — Telephone Encounter (Signed)
Spoke with patient, I ordered a script for her to have the pneumovax 23 vaccine at [facility] in [place]. I also ordered the pneumoniae 23 post titers to be drawn at [facility] 6 weeks after vaccine.

## 2017-10-17 NOTE — Addendum Note (Signed)
Addended by: Maryjean Morn D on: 10/17/2017 09:52 AM   Modules accepted: Orders

## 2017-11-07 DIAGNOSIS — R41840 Attention and concentration deficit: Secondary | ICD-10-CM | POA: Diagnosis not present

## 2017-11-07 DIAGNOSIS — F329 Major depressive disorder, single episode, unspecified: Secondary | ICD-10-CM | POA: Diagnosis not present

## 2017-11-07 DIAGNOSIS — Z6841 Body Mass Index (BMI) 40.0 and over, adult: Secondary | ICD-10-CM | POA: Diagnosis not present

## 2017-12-18 ENCOUNTER — Encounter: Payer: BLUE CROSS/BLUE SHIELD | Admitting: Allergy & Immunology

## 2018-03-14 DIAGNOSIS — Z6841 Body Mass Index (BMI) 40.0 and over, adult: Secondary | ICD-10-CM | POA: Diagnosis not present

## 2018-03-14 DIAGNOSIS — Z23 Encounter for immunization: Secondary | ICD-10-CM | POA: Diagnosis not present

## 2018-03-14 DIAGNOSIS — F329 Major depressive disorder, single episode, unspecified: Secondary | ICD-10-CM | POA: Diagnosis not present

## 2018-03-14 DIAGNOSIS — R41840 Attention and concentration deficit: Secondary | ICD-10-CM | POA: Diagnosis not present

## 2019-03-27 DIAGNOSIS — R102 Pelvic and perineal pain: Secondary | ICD-10-CM | POA: Diagnosis not present

## 2019-03-27 DIAGNOSIS — Z23 Encounter for immunization: Secondary | ICD-10-CM | POA: Diagnosis not present

## 2019-03-27 DIAGNOSIS — F329 Major depressive disorder, single episode, unspecified: Secondary | ICD-10-CM | POA: Diagnosis not present

## 2019-03-27 DIAGNOSIS — R41840 Attention and concentration deficit: Secondary | ICD-10-CM | POA: Diagnosis not present

## 2019-04-03 DIAGNOSIS — R102 Pelvic and perineal pain: Secondary | ICD-10-CM | POA: Diagnosis not present

## 2019-04-03 DIAGNOSIS — Z90710 Acquired absence of both cervix and uterus: Secondary | ICD-10-CM | POA: Diagnosis not present

## 2019-04-14 DIAGNOSIS — Z1231 Encounter for screening mammogram for malignant neoplasm of breast: Secondary | ICD-10-CM | POA: Diagnosis not present

## 2019-04-23 DIAGNOSIS — N6001 Solitary cyst of right breast: Secondary | ICD-10-CM | POA: Diagnosis not present

## 2019-05-07 ENCOUNTER — Other Ambulatory Visit: Payer: Self-pay

## 2019-05-07 DIAGNOSIS — Z20822 Contact with and (suspected) exposure to covid-19: Secondary | ICD-10-CM

## 2019-05-08 LAB — NOVEL CORONAVIRUS, NAA: SARS-CoV-2, NAA: NOT DETECTED

## 2019-05-13 DIAGNOSIS — Z209 Contact with and (suspected) exposure to unspecified communicable disease: Secondary | ICD-10-CM | POA: Diagnosis not present

## 2019-05-30 HISTORY — PX: BRAIN SURGERY: SHX531

## 2019-06-23 DIAGNOSIS — Z23 Encounter for immunization: Secondary | ICD-10-CM | POA: Diagnosis not present

## 2019-06-23 DIAGNOSIS — F900 Attention-deficit hyperactivity disorder, predominantly inattentive type: Secondary | ICD-10-CM | POA: Diagnosis not present

## 2019-06-23 DIAGNOSIS — Z0000 Encounter for general adult medical examination without abnormal findings: Secondary | ICD-10-CM | POA: Diagnosis not present

## 2019-06-23 DIAGNOSIS — N951 Menopausal and female climacteric states: Secondary | ICD-10-CM | POA: Diagnosis not present

## 2019-06-23 DIAGNOSIS — E782 Mixed hyperlipidemia: Secondary | ICD-10-CM | POA: Diagnosis not present

## 2019-07-07 DIAGNOSIS — E049 Nontoxic goiter, unspecified: Secondary | ICD-10-CM | POA: Diagnosis not present

## 2019-09-15 DIAGNOSIS — N3281 Overactive bladder: Secondary | ICD-10-CM | POA: Diagnosis not present

## 2019-09-15 DIAGNOSIS — F329 Major depressive disorder, single episode, unspecified: Secondary | ICD-10-CM | POA: Diagnosis not present

## 2019-09-15 DIAGNOSIS — R41840 Attention and concentration deficit: Secondary | ICD-10-CM | POA: Diagnosis not present

## 2019-11-07 DIAGNOSIS — R351 Nocturia: Secondary | ICD-10-CM | POA: Diagnosis not present

## 2019-12-04 ENCOUNTER — Encounter: Payer: Self-pay | Admitting: Neurology

## 2019-12-04 ENCOUNTER — Ambulatory Visit: Payer: BC Managed Care – PPO | Admitting: Neurology

## 2019-12-04 ENCOUNTER — Other Ambulatory Visit: Payer: Self-pay

## 2019-12-04 VITALS — BP 126/84 | Ht 63.0 in | Wt 242.0 lb

## 2019-12-04 DIAGNOSIS — G4733 Obstructive sleep apnea (adult) (pediatric): Secondary | ICD-10-CM | POA: Diagnosis not present

## 2019-12-04 DIAGNOSIS — G4719 Other hypersomnia: Secondary | ICD-10-CM

## 2019-12-04 DIAGNOSIS — R0683 Snoring: Secondary | ICD-10-CM

## 2019-12-04 DIAGNOSIS — Z6841 Body Mass Index (BMI) 40.0 and over, adult: Secondary | ICD-10-CM

## 2019-12-04 DIAGNOSIS — R351 Nocturia: Secondary | ICD-10-CM

## 2019-12-04 NOTE — Patient Instructions (Addendum)
Thank you for choosing [facility] for your sleep related care! It was nice to meet you today! I appreciate that you entrust me with your sleep related healthcare concerns. I hope, I was able to address at least some of your concerns today, and that I can help you feel reassured and also get better.    Here is what we discussed today and what we came up with as our plan for you:    Based on your symptoms and your exam I believe you are still at risk for obstructive sleep apnea (aka OSA), and I think we should proceed with a sleep study to determine whether you do or do not have OSA and how severe it is. Even, if you have mild OSA, I may want you to consider treatment with CPAP, as treatment of even borderline or mild sleep apnea can result and improvement of symptoms such as sleep disruption, daytime sleepiness, nighttime bathroom breaks, restless leg symptoms, improvement of headache syndromes, even improved mood disorder.   Please remember, the long-term risks and ramifications of untreated moderate to severe obstructive sleep apnea are: increased Cardiovascular disease, including congestive heart failure, stroke, difficult to control hypertension, treatment resistant obesity, arrhythmias, especially irregular heartbeat commonly known as A. Fib. (atrial fibrillation); even type 2 diabetes has been linked to untreated OSA.   Sleep apnea can cause disruption of sleep and sleep deprivation in most cases, which, in turn, can cause recurrent headaches, problems with memory, mood, concentration, focus, and vigilance. Most people with untreated sleep apnea report excessive daytime sleepiness, which can affect their ability to drive. Please do not drive if you feel sleepy. Patients with sleep apnea developed difficulty initiating and maintaining sleep (aka insomnia).   Having sleep apnea may increase your risk for other sleep disorders, including involuntary behaviors sleep such as sleep  terrors, sleep talking, sleepwalking.    Having sleep apnea can also increase your risk for restless leg syndrome and leg movements at night.   Please note that untreated obstructive sleep apnea may carry additional perioperative morbidity. Patients with significant obstructive sleep apnea (typically, in the moderate to severe degree) should receive, if possible, perioperative PAP (positive airway pressure) therapy and the surgeons and particularly the anesthesiologists should be informed of the diagnosis and the severity of the sleep disordered breathing.   I will likely see you back after your sleep study to go over the test results and where to go from there. We will call you after your sleep study to advise about the results (most likely, you will hear from Kristen, my nurse) and to set up an appointment at the time, as necessary.    Our sleep lab administrative assistant will call you to schedule your sleep study and give you further instructions, regarding the check in process for the sleep study, arrival time, what to bring, when you can expect to leave after the study, etc., and to answer any other logistical questions you may have. If you don't hear back from her by about 2 weeks from now, please feel free to call her direct line at [phone] or you can call our general clinic number, or email us through My Chart.   

## 2019-12-04 NOTE — Progress Notes (Signed)
Subjective:    Patient ID: [name] is a 54 y.o. female.  HPI     Mackenzie Foley, MD, PhD [facility] [address], [address] [address] [place], [place] [address]  Dear Dr. Marlou Rodriguez,   I saw your patient, [name], upon your kind request, in my Sleep clinic today for initial consultation of [name] sleep disorder, in particular, evaluation of [name] prior diagnosis of OSA.  The patient is unaccompanied today.  As you know, [name] is a 54 year old right-handed woman with an underlying medical history of allergic rhinitis, ADD, goiter and morbid obesity with a BMI of over 40, who was previously diagnosed with obstructive sleep apnea and placed on CPAP therapy.  She could not tolerate CPAP at the time and is no longer on treatment.  Prior sleep study results are not available for my review today, testing was about 10 years ago and a CPAP compliance report is not available.  I reviewed your office note from 11/07/19.  [name] Epworth sleepiness score is 7 out of 24, fatigue severity score is 55  out of 63.  She had difficulty tolerating the mask.  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  She eventually gave the machine back.  She has significant nocturia, 2-4 times per average night.  She has ongoing issues with snoring and sleep disruption.  She and [name] husband live with [name] mom who has dementia, the patient's bedroom is in the basement which does not have the bathroom.  She has a bedside commode as she reports that she would not make it quickly enough to the bathroom at night.  She does not typically wake up with a headache and is not aware of any family history of OSA but father snored.  He died from heart disease.  [name] mom has dementia, she is 53 years old.  Patient reports working on weight loss.  She has been able to lose about 40 pounds since last year.  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  Bedtime varies but is typically after 11 or around 1130.  Rise time typically around 8.  She typically does not have to be at work until 3 PM.  She would be willing to get retested for sleep apnea and consider CPAP therapy again.  She quit smoking in 1996 drinks, drinks caffeine in the form of diet Dr. Reino Kent, 3 bottles per day, tries to drink ginger ale at night.  She drinks alcohol very occasionally, typically on special occasions.  She had a recent appointment with ENT for evaluation of [name] goiter. Of note, she has been on Concerta for years.  Without the Concerta she would be very sleepy she indicates and therefore [name] Epworth sleepiness score may be underestimated.  [name] Past Medical History Is Significant For: Past Medical History:  Diagnosis Date  . ADD (attention deficit disorder)   . Sleep apnea    no cpap, sleep study 11-24-2010 on chart    [name] Past Surgical History Is Significant For: Past Surgical History:  Procedure Laterality Date  . CESAREAN SECTION  1989  . HERNIA REPAIR  2013   Lap Ventral Hernia Repair  . PARTIAL HYSTERECTOMY  jan 2012  . TUBAL LIGATION  1992  . VENTRAL HERNIA REPAIR  01/09/2012   Procedure: LAPAROSCOPIC VENTRAL HERNIA;  Surgeon: Sharlet Salina T  Hoxworth, MD;  Location: [facility];  Service: General;  Laterality: N/A;  Laparoscopic Ventral Hernia Repair  . WISDOM TOOTH EXTRACTION  2003    [name] Family History Is Significant For: Family History  Problem Relation Age of Onset  . Asthma Neg Hx   . Allergic rhinitis Neg Hx   . Angioedema Neg Hx   . Eczema Neg Hx   . Immunodeficiency Neg Hx   . Urticaria Neg Hx     [name] Social History Is Significant For: Social History   Socioeconomic History  . Marital status: Married    Spouse name: Not on file  . Number of children: Not on file  . Years of education: Not on file  . Highest education level: Not on file  Occupational History  . Not on file   Tobacco Use  . Smoking status: Former Smoker    Years: 5.00    Types: Cigarettes    Quit date: 05/29/1990    Years since quitting: 29.5  . Smokeless tobacco: Never Used  Vaping Use  . Vaping Use: Never used  Substance and Sexual Activity  . Alcohol use: No    Comment: rare occasion  . Drug use: No  . Sexual activity: Not on file  Other Topics Concern  . Not on file  Social History Narrative  . Not on file   Social Determinants of Health   Financial Resource Strain:   . Difficulty of Paying Living Expenses:   Food Insecurity:   . Worried About Programme researcher, broadcasting/film/video in the Last Year:   . Barista in the Last Year:   Transportation Needs:   . Freight forwarder (Medical):   Marland Kitchen Lack of Transportation (Non-Medical):   Physical Activity:   . Days of Exercise per Week:   . Minutes of Exercise per Session:   Stress:   . Feeling of Stress :   Social Connections:   . Frequency of Communication with Friends and Family:   . Frequency of Social Gatherings with Friends and Family:   . Attends Religious Services:   . Active Member of Clubs or Organizations:   . Attends Banker Meetings:   Marland Kitchen Marital Status:     [name] Allergies Are:  No Known Allergies:   [name] Current Medications Are:  Outpatient Encounter Medications as of 12/04/2019  Medication Sig  . albuterol (PROAIR HFA) 108 (90 Base) MCG/ACT inhaler Inhale 2 puffs into the lungs every 4 (four) hours as needed for wheezing or shortness of breath.  Marland Kitchen amoxicillin-clavulanate (AUGMENTIN) 875-125 MG tablet PRN  . Carbinoxamine Maleate 4 MG TABS Take 1 tablet (4 mg total) by mouth every 8 (eight) hours as needed.  . fluticasone (FLOVENT HFA) 220 MCG/ACT inhaler Inhale 2 puffs into the lungs 2 (two) times daily. (Patient taking differently: Inhale 2 puffs into the lungs 2 (two) times daily. PRN)  . Fluticasone Propionate (XHANCE) 93 MCG/ACT EXHU Place 2 puffs into the nose 2 (two) times daily. (Patient taking  differently: Place 2 puffs into the nose 2 (two) times daily. PRN)  . loratadine (CLARITIN) 10 MG tablet Take 10 mg by mouth daily. PRN  . methylphenidate (CONCERTA) 54 MG CR tablet Take 54 mg by mouth every morning.  . montelukast (SINGULAIR) 10 MG tablet Take 1 tablet (10 mg total) by mouth at bedtime. (Patient taking differently: Take 10 mg by mouth at bedtime. PRN)  . [DISCONTINUED] rosuvastatin (CRESTOR) 10 MG tablet Take 10 mg by mouth daily.  No facility-administered encounter medications on file as of 12/04/2019.  :  Review of Systems:  Out of a complete 14 point review of systems, all are reviewed and negative with the exception of these symptoms as listed below: Review of Systems  Neurological:       Here for sleep consult. Prior sleep study 10+ years ago per pt.  Reports she tired cpap but could not tolerate. Here for revaluation.  Epworth Sleepiness Scale 0= would never doze 1= slight chance of dozing 2= moderate chance of dozing 3= high chance of dozing  Sitting and reading:2 Watching TV:2 Sitting inactive in a public place (ex. Theater or meeting):0 As a passenger in a car for an hour without a break:0 Lying down to rest in the afternoon:2 Sitting and talking to someone:0 Sitting quietly after lunch (no alcohol):1 In a car, while stopped in traffic:0 Total:7     Objective:  Neurological Exam  Physical Exam Physical Examination:   Vitals:   12/04/19 0754  BP: 126/84    General Examination: The patient is a very pleasant 54 y.o. female in no acute distress. She appears well-developed and well-nourished and well groomed.   HEENT: Normocephalic, atraumatic, pupils are equal, round and reactive to light, extraocular tracking is good without limitation to gaze excursion or nystagmus noted. Hearing is grossly intact. Face is symmetric with normal facial animation. Speech is clear with no dysarthria noted. There is no hypophonia. There is no lip, neck/head, jaw or  voice tremor. Neck is supple with full range of passive and active motion. There are no carotid bruits on auscultation. Oropharynx exam reveals: mild mouth dryness, adequate dental hygiene and mild airway crowding, due to small airway entry.  Tonsils are small, she has a mild overbite, neck circumference is 14-5/8 inches.  She appears to have a mild goiter.  Tongue protrudes centrally and palate elevates symmetrically.  Chest: Clear to auscultation without wheezing, rhonchi or crackles noted.  Heart: S1+S2+0, regular and normal without murmurs, rubs or gallops noted.   Abdomen: Soft, non-tender and non-distended with normal bowel sounds appreciated on auscultation.  Extremities: There is no pitting edema in the distal lower extremities bilaterally.   Skin: Warm and dry without trophic changes noted.  Some well-healed scars in the legs bilaterally, particularly left leg, reports that she had poison ivy recently.  Musculoskeletal: exam reveals no obvious joint deformities, tenderness or joint swelling or erythema.   Neurologically:  Mental status: The patient is awake, alert and oriented in all 4 spheres. [name] immediate and remote memory, attention, language skills and fund of knowledge are appropriate. There is no evidence of aphasia, agnosia, apraxia or anomia. Speech is clear with normal prosody and enunciation. Thought process is linear. Mood is normal and affect is normal.  Cranial nerves II - XII are as described above under HEENT exam.  Motor exam: Normal bulk, strength and tone is noted. There is no tremor, Romberg is negative. Fine motor skills and coordination: grossly intact.  Cerebellar testing: No dysmetria or intention tremor. There is no truncal or gait ataxia.  Sensory exam: intact to light touch in the upper and lower extremities.  Gait, station and balance: She stands easily. No veering to one side is noted. No leaning to one side is noted. Posture is age-appropriate and stance is  narrow based. Gait shows normal stride length and normal pace. No problems turning are noted. Tandem walk is unremarkable.  Assessment and Plan:   In summary, [name] is a very pleasant 54 y.o.-year old female with an underlying medical history of allergic rhinitis, ADD, goiter and morbid obesity with a BMI of over 40, who presents for evaluation of [name] sleep disorder, in particular, [name] prior diagnosis of obstructive sleep apnea.  She was tried on CPAP but could not tolerate it at the time.  She has significant sleep disruption, daytime somnolence and nocturia.  She would be willing to get retested and consider CPAP therapy again.  I explained to [name] that there may be newer interface options available for [name] that may make it more tolerable for [name].   I had a long chat with the patient about my findings and the diagnosis of OSA, its prognosis and treatment options. We talked about medical treatments, surgical interventions and non-pharmacological approaches. I explained in particular the risks and ramifications of untreated moderate to severe OSA, especially with respect to developing cardiovascular disease down the Road, including congestive heart failure, difficult to treat hypertension, cardiac arrhythmias, or stroke. Even type 2 diabetes has, in part, been linked to untreated OSA. Symptoms of untreated OSA include daytime sleepiness, memory problems, mood irritability and mood disorder such as depression and anxiety, lack of energy, as well as recurrent headaches, especially morning headaches. We talked about trying to maintain a healthy lifestyle in general, as well as the importance of weight control.  She is commended on [name] weight loss success thus far and encouraged to continue to work on it.  We also talked about the importance of good sleep hygiene. I recommended the following at this time: sleep study.   I explained the sleep test procedure to the patient and also outlined  possible surgical and non-surgical treatment options of OSA, including the use of a custom-made dental device (which would require a referral to a specialist dentist or oral surgeon), upper airway surgical options, such as traditional UPPP or a novel less invasive surgical option in the form of Inspire hypoglossal nerve stimulation (which would involve a referral to an ENT surgeon). I also explained the CPAP treatment option to the patient, who indicated that she would be willing to try CPAP if the need arises. I explained the importance of being compliant with PAP treatment, not only for insurance purposes but primarily to improve [name] symptoms, and for the patient's long term health benefit, including to reduce [name] cardiovascular risks. I answered all [name] questions today and the patient was in agreement. I plan to see [name] back after the sleep study is completed and encouraged [name] to call with any interim questions, concerns, problems or updates.   Thank you very much for allowing me to participate in the care of this nice patient. If I can be of any further assistance to you please do not hesitate to call me at [phone].  Sincerely,   Mackenzie Foley, MD, PhD

## 2019-12-15 ENCOUNTER — Telehealth: Payer: Self-pay

## 2019-12-15 NOTE — Telephone Encounter (Signed)
Called to schedule sleep study. Pt wants to talk with insurance about her oop cost first. 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 Will wait for pt to call me back.

## 2019-12-16 DIAGNOSIS — F329 Major depressive disorder, single episode, unspecified: Secondary | ICD-10-CM | POA: Diagnosis not present

## 2019-12-16 DIAGNOSIS — R41840 Attention and concentration deficit: Secondary | ICD-10-CM | POA: Diagnosis not present

## 2019-12-16 DIAGNOSIS — N3281 Overactive bladder: Secondary | ICD-10-CM | POA: Diagnosis not present

## 2019-12-28 ENCOUNTER — Ambulatory Visit (INDEPENDENT_AMBULATORY_CARE_PROVIDER_SITE_OTHER): Payer: BC Managed Care – PPO | Admitting: Neurology

## 2019-12-28 DIAGNOSIS — G4720 Circadian rhythm sleep disorder, unspecified type: Secondary | ICD-10-CM

## 2019-12-28 DIAGNOSIS — R0683 Snoring: Secondary | ICD-10-CM

## 2019-12-28 DIAGNOSIS — G4719 Other hypersomnia: Secondary | ICD-10-CM

## 2019-12-28 DIAGNOSIS — R351 Nocturia: Secondary | ICD-10-CM

## 2019-12-28 DIAGNOSIS — G4733 Obstructive sleep apnea (adult) (pediatric): Secondary | ICD-10-CM

## 2019-12-28 DIAGNOSIS — Z6841 Body Mass Index (BMI) 40.0 and over, adult: Secondary | ICD-10-CM

## 2020-01-12 NOTE — Addendum Note (Signed)
Addended by: [name] on: 01/12/2020 01:46 PM   Modules accepted: Orders

## 2020-01-12 NOTE — Progress Notes (Signed)
Patient referred by Dr. Marlou Porch, seen by me on 12/04/19, diagnostic PSG on 12/28/19.    Please call and notify the patient that the recent sleep study did confirm the diagnosis of obstructive sleep apnea, but OSA is mild, even borderline, but she did have some drops in O2 saturations. I recommend treatment for this in the form of autoPAP, which means, that we don't have to bring her back for a second sleep study with CPAP, but will let him try an autoPAP machine at home, through a DME company (of her choice, or as per insurance requirement). The DME representative will educate her on how to use the machine, how to put the mask on, etc. I have placed an order in the chart. Please send referral, talk to patient, send report to referring MD. We will need a FU in sleep clinic for 10 weeks post-PAP set up, please arrange that with me or one of our NPs. Thanks,   [name], MD, PhD [facility])

## 2020-01-12 NOTE — Procedures (Signed)
PATIENTS NAME:  Mackenzie, Rodriguez DOB:      Aug 23, 1965      MR#:    [id number]     DATE OF RECORDING: 12/28/2019 REFERRING M.D.:  Dr. Stephannie Li Performed:   Baseline Polysomnogram HISTORY: 54 year old woman with a history of allergic rhinitis, ADD, goiter and morbid obesity with a BMI of over 40, who was previously diagnosed with obstructive sleep apnea and placed on CPAP therapy. She could not tolerate CPAP at the time and is no longer on treatment. The patient endorsed the Epworth Sleepiness Scale at 7/24 points (on Concerta). The patients weight 242 pounds with a height of 63 (inches), resulting in a BMI of 43. kg/m2. The patients neck circumference measured 14.7 inches.  CURRENT MEDICATIONS: Proair, Augmentin, Flovent, Xhance, Claritin, Concerta, Singulair.   PROCEDURE:  This is a multichannel digital polysomnogram utilizing the Somnostar 11.2 system.  Electrodes and sensors were applied and monitored per AASM Specifications.   EEG, EOG, Chin and Limb EMG, were sampled at 200 Hz.  ECG, Snore and Nasal Pressure, Thermal Airflow, Respiratory Effort, CPAP Flow and Pressure, Oximetry was sampled at 50 Hz. Digital video and audio were recorded.      BASELINE STUDY  Lights Out was at 22:10 and Lights On at 04:57.  Total recording time (TRT) was 407.5 minutes, with a total sleep time (TST) of 356.5 minutes.   The patients sleep latency was 20 minutes.  REM latency was 96.5 minutes, which is normal. The sleep efficiency was 87.5 %.     SLEEP ARCHITECTURE: WASO (Wake after sleep onset) was 45 minutes with mild to moderate sleep fragmentation noted. There were 17 minutes in Stage N1, 253.5 minutes Stage N2, 28.5 minutes Stage N3 and 57.5 minutes in Stage REM.  The percentage of Stage N1 was 4.8%, Stage N2 was 71.1%, which is increased, Stage N3 was 8.% and Stage R (REM sleep) was 16.1%, which is mildly reduced. The arousals were noted as: 58 were spontaneous, 6 were associated with PLMs, 7 were  associated with respiratory events.  RESPIRATORY ANALYSIS:  There were a total of 30 respiratory events:  7 obstructive apneas, 3 central apneas and 0 mixed apneas with a total of 10 apneas and an apnea index (AI) of 1.7 /hour. There were 20 hypopneas with a hypopnea index of 3.4 /hour. The patient also had 0 respiratory event related arousals (RERAs).      The total APNEA/HYPOPNEA INDEX (AHI) was 5./hour and the total RESPIRATORY DISTURBANCE INDEX was  5. /hour.  10 events occurred in REM sleep and 28 events in NREM. The REM AHI was  10.4 /hour, versus a non-REM AHI of 4.. The patient spent 121.5 minutes of total sleep time in the supine position and 235 minutes in non-supine.. The supine AHI was 4.0 versus a non-supine AHI of 5.6.  OXYGEN SATURATION & C02:  The Wake baseline 02 saturation was 92%, with the lowest being 65%. Time spent below 89% saturation equaled 5 minutes.  PERIODIC LIMB MOVEMENTS: The patient had a total of 10 Periodic Limb Movements.  The Periodic Limb Movement (PLM) index was 1.7 and the PLM Arousal index was 1./hour.  Audio and video analysis did not show any abnormal or unusual movements, behaviors, phonations or vocalizations. The patient took 1 bathroom break. Moderate to loud snoring was noted. The EKG was in keeping with normal sinus rhythm (NSR).  Post-study, the patient indicated that sleep was worse than usual.   IMPRESSION:  1. Obstructive Sleep Apnea (  OSA) 2. Dysfunctions associated with sleep stages or arousal from sleep  RECOMMENDATIONS:  1. This study demonstrates overall borderline obstructive sleep apnea, with a total AHI of 5/hour and O2 nadir of 65% (briefly, during REM sleep). Given the patients medical history and sleep related complaints, treatment with positive airway pressure is recommended; this can be achieved in the form of autoPAP. Alternatively, a full-night CPAP titration study would allow optimization of therapy if needed. Other treatment  options may include avoidance of supine sleep position along with weight loss, upper airway or jaw surgery in selected patients or the use of an oral appliance in certain patients. ENT evaluation and/or consultation with a maxillofacial surgeon or dentist may be feasible in some instances.    2. Please note that untreated obstructive sleep apnea may carry additional perioperative morbidity. Patients with significant obstructive sleep apnea should receive perioperative PAP therapy and the surgeons and particularly the anesthesiologist should be informed of the diagnosis and the severity of the sleep disordered breathing. 3. This study shows sleep fragmentation and abnormal sleep stage percentages; these are nonspecific findings and per se do not signify an intrinsic sleep disorder or a cause for the patient's sleep-related symptoms. Causes include (but are not limited to) the first night effect of the sleep study, circadian rhythm disturbances, medication effect or an underlying mood disorder or medical problem.  4. The patient should be cautioned not to drive, work at heights, or operate dangerous or heavy equipment when tired or sleepy. Review and reiteration of good sleep hygiene measures should be pursued with any patient. 5. The patient will be seen in follow-up by Dr. Frances Furbish at [facility] for discussion of the test results and further management strategies. The referring provider will be notified of the test results.  I certify that I have reviewed the entire raw data recording prior to the issuance of this report in accordance with the Standards of Accreditation of the American Academy of Sleep Medicine (AASM)  [name], MD, PhD Diplomat, American Board of Neurology and Sleep Medicine (Neurology and Sleep Medicine)

## 2020-01-13 ENCOUNTER — Telehealth: Payer: Self-pay

## 2020-01-13 NOTE — Telephone Encounter (Signed)
I called [name]. I advised [name] that Dr. Frances Furbish reviewed their sleep study results and found that [name] had mild osa with o2 desaturations. Dr. Frances Furbish recommends that [name] start autopap for treatment. I reviewed PAP compliance expectations with the [name]. [name] is agreeable to starting an auto-PAP. I advised [name] that an order will be sent to a DME, [employer], and [employer] will call the [name] within about one week after they file with the [name]'s insurance. [employer] will show the [name] how to use the machine, fit for masks, and troubleshoot the auto-PAP if needed. A follow up appt was made for insurance purposes with Dr. Frances Furbish on 03/25/2020 at 300 pm. [name] verbalized understanding to arrive 15 minutes early and bring their auto-PAP. A letter with all of this information in it will be mailed to the [name] as a reminder. I verified with the [name] that the address we have on file is correct. [name] verbalized understanding of results. [name] had no questions at this time but was encouraged to call back if questions arise. I have sent the order to [employer] and have received confirmation that they have received the order.

## 2020-01-13 NOTE — Telephone Encounter (Signed)
-----   Message from [name], MD sent at 01/12/2020  1:46 PM EDT ----- Patient referred by Dr. Marlou Porch, seen by me on 12/04/19, diagnostic PSG on 12/28/19.    Please call and notify the patient that the recent sleep study did confirm the diagnosis of obstructive sleep apnea, but OSA is mild, even borderline, but she did have some drops in O2 saturations. I recommend treatment for this in the form of autoPAP, which means, that we don't have to bring her back for a second sleep study with CPAP, but will let him try an autoPAP machine at home, through a DME company (of her choice, or as per insurance requirement). The DME representative will educate her on how to use the machine, how to put the mask on, etc. I have placed an order in the chart. Please send referral, talk to patient, send report to referring MD. We will need a FU in sleep clinic for 10 weeks post-PAP set up, please arrange that with me or one of our NPs. Thanks,   [name], MD, PhD [facility])

## 2020-01-22 ENCOUNTER — Other Ambulatory Visit: Payer: Self-pay

## 2020-01-22 ENCOUNTER — Other Ambulatory Visit: Payer: Self-pay | Admitting: Critical Care Medicine

## 2020-01-22 DIAGNOSIS — Z20822 Contact with and (suspected) exposure to covid-19: Secondary | ICD-10-CM

## 2020-01-24 LAB — NOVEL CORONAVIRUS, NAA: SARS-CoV-2, NAA: NOT DETECTED

## 2020-01-24 LAB — SARS-COV-2, NAA 2 DAY TAT

## 2020-01-26 ENCOUNTER — Telehealth: Payer: Self-pay | Admitting: Neurology

## 2020-01-26 NOTE — Telephone Encounter (Signed)
Pt would like to speak with the nurse concerning CPAP machine company.

## 2020-01-26 NOTE — Telephone Encounter (Addendum)
I called the pt. She sts [employer] has given her an est start date for 02/13/2020 on her autocpap. Pt wanted to know if this start date would still be ok in relation to her 31-90 day f/u. I advised as long as the start date is by 02/23/2020 she would be seen in the appropriate amount of time. Her f/u right now is schedule for 03/25/2020. Pt verbalized understanding and had no further questions/concerns.

## 2020-02-19 DIAGNOSIS — Z20828 Contact with and (suspected) exposure to other viral communicable diseases: Secondary | ICD-10-CM | POA: Diagnosis not present

## 2020-03-04 DIAGNOSIS — J209 Acute bronchitis, unspecified: Secondary | ICD-10-CM | POA: Diagnosis not present

## 2020-03-04 DIAGNOSIS — Z20828 Contact with and (suspected) exposure to other viral communicable diseases: Secondary | ICD-10-CM | POA: Diagnosis not present

## 2020-03-09 DIAGNOSIS — G4733 Obstructive sleep apnea (adult) (pediatric): Secondary | ICD-10-CM | POA: Diagnosis not present

## 2020-03-15 ENCOUNTER — Telehealth: Payer: Self-pay | Admitting: Neurology

## 2020-03-15 NOTE — Telephone Encounter (Signed)
I called the pt. She reports she started her cpap on 03/09/2020 and since has noticed heart burn and h/a. Pt reports connectivity issues at her house and trouble with her data registering appropriately on the mobile app. I reviewed the Res med site and 3 days worth of data was stored with only 03/11/2020 showing 4 hrs of usage.  Pt reports she recently had covid and is still recovering.   I advised the pt to contact her DME to trouble shoot the connectivity issues she has been having. I also advised we need a little more data before making pressure changes. I further advised the heartburn and h/a could be r/t to covid ( esp the h/a).  Pt was advised to call us back in 1 week so we could check status of compliance and see how her sx are faring.  Pt was agreeable to this plan and had no further question/concern. Pt was advised to call back if her sx were to worsen or if she had any new concerns.

## 2020-03-15 NOTE — Telephone Encounter (Signed)
Pt called wanting to discuss issues she is having with her Cpap. Pt states that she began to have heartburn and headaches since she started the cpap machine. Please advise.

## 2020-03-15 NOTE — Telephone Encounter (Signed)
A compliance card download may be more accurate for her.  She can certainly talk to her DME company about getting a data card inserted into her machine.  If she had Covid and Covid related residual problems I would also recommend a follow-up with her primary care provider.

## 2020-03-23 DIAGNOSIS — R41840 Attention and concentration deficit: Secondary | ICD-10-CM | POA: Diagnosis not present

## 2020-03-23 DIAGNOSIS — F329 Major depressive disorder, single episode, unspecified: Secondary | ICD-10-CM | POA: Diagnosis not present

## 2020-03-23 DIAGNOSIS — Z23 Encounter for immunization: Secondary | ICD-10-CM | POA: Diagnosis not present

## 2020-03-23 DIAGNOSIS — N3281 Overactive bladder: Secondary | ICD-10-CM | POA: Diagnosis not present

## 2020-03-25 ENCOUNTER — Ambulatory Visit: Payer: Self-pay | Admitting: Neurology

## 2020-04-08 NOTE — Telephone Encounter (Signed)
Please schedule the patient before 06/09/2020

## 2020-04-08 NOTE — Telephone Encounter (Signed)
Called the patient because she was scheduled for a initial visit for Monday and wanted to make sure she brought her machine to her visit. She states she has been sick and she will need to push that apt out. She will call back tomorrow to schedule

## 2020-04-12 ENCOUNTER — Ambulatory Visit: Payer: Self-pay | Admitting: Neurology

## 2020-10-28 ENCOUNTER — Other Ambulatory Visit: Payer: Self-pay

## 2020-10-28 ENCOUNTER — Ambulatory Visit
Admission: EM | Admit: 2020-10-28 | Discharge: 2020-10-28 | Disposition: A | Discharge status: Home / Self Care | Payer: BC Managed Care – PPO

## 2020-10-28 ENCOUNTER — Encounter: Payer: Self-pay | Admitting: Emergency Medicine

## 2020-10-28 DIAGNOSIS — L237 Allergic contact dermatitis due to plants, except food: Secondary | ICD-10-CM

## 2020-10-28 MED ORDER — METHYLPREDNISOLONE 4 MG PO TBPK: ORAL_TABLET | ORAL | 0 refills | Status: DC

## 2020-10-28 NOTE — ED Triage Notes (Signed)
poison ivy since Saturday.  Rash all over and on face under left eye.

## 2020-10-28 NOTE — Discharge Instructions (Addendum)
You may get a wash TECNU to wash the oils of the plant

## 2020-10-28 NOTE — ED Provider Notes (Signed)
[facility]    CSN: [id number] Arrival date & time: 10/28/20  1331      History   Chief Complaint No chief complaint on file.   HPI [name] is a 55 y.o. female who presents with poison IV rash x 5 days. She was pulling weeds and apparently some of them was poison IV she found out.     Past Medical History:  Diagnosis Date  . ADD (attention deficit disorder)   . Sleep apnea    no cpap, sleep study 11-24-2010 on chart    Patient Active Problem List   Diagnosis Date Noted  . Cough, persistent 10/03/2017  . Seasonal and perennial allergic rhinitis 10/03/2017  . Wheezing/coughing 10/03/2017  . Recurrent respiratory infections 10/03/2017  . Multiple thyroid nodules 03/15/2015  . Abdominal pain, chronic, right lower quadrant 11/20/2011  . Rectal bleeding 11/20/2011    Past Surgical History:  Procedure Laterality Date  . CESAREAN SECTION  1989  . HERNIA REPAIR  2013   Lap Ventral Hernia Repair  . PARTIAL HYSTERECTOMY  jan 2012  . TUBAL LIGATION  1992  . VENTRAL HERNIA REPAIR  01/09/2012   Procedure: LAPAROSCOPIC VENTRAL HERNIA;  Surgeon: Mariella Saa, MD;  Location: [facility];  Service: General;  Laterality: N/A;  Laparoscopic Ventral Hernia Repair  . WISDOM TOOTH EXTRACTION  2003    OB History   No obstetric history on file.      Home Medications    Prior to Admission medications   Medication Sig Start Date End Date Taking? Authorizing Provider  atorvastatin (LIPITOR) 40 MG tablet Take 40 mg by mouth daily.   Yes [provider]  escitalopram (LEXAPRO) 10 MG tablet Take 10 mg by mouth daily.   Yes [provider]  levETIRAcetam (KEPPRA) 500 MG tablet Take 500 mg by mouth 2 (two) times daily.   Yes [provider]  lisinopril (ZESTRIL) 10 MG tablet Take 10 mg by mouth daily.   Yes [provider]  methylPREDNISolone (MEDROL DOSEPAK) 4 MG TBPK tablet Take as directed 10/28/20  Yes Rodriguez-Southworth,  Nettie Elm, PA-C  albuterol (PROAIR HFA) 108 (90 Base) MCG/ACT inhaler Inhale 2 puffs into the lungs every 4 (four) hours as needed for wheezing or shortness of breath. 10/03/17   Bobbitt, Heywood Iles, MD  Carbinoxamine Maleate 4 MG TABS Take 1 tablet (4 mg total) by mouth every 8 (eight) hours as needed. 10/03/17   Bobbitt, Heywood Iles, MD  fluticasone (FLOVENT HFA) 220 MCG/ACT inhaler Inhale 2 puffs into the lungs 2 (two) times daily. Patient taking differently: Inhale 2 puffs into the lungs 2 (two) times daily. PRN 10/03/17   Bobbitt, Heywood Iles, MD  Fluticasone Propionate Timmothy Sours) 93 MCG/ACT EXHU Place 2 puffs into the nose 2 (two) times daily. Patient taking differently: Place 2 puffs into the nose 2 (two) times daily. PRN 10/03/17   Bobbitt, Heywood Iles, MD  methylphenidate (CONCERTA) 54 MG CR tablet Take 27 mg by mouth every morning.    [provider]  montelukast (SINGULAIR) 10 MG tablet Take 1 tablet (10 mg total) by mouth at bedtime. Patient taking differently: Take 10 mg by mouth at bedtime. PRN 10/03/17   Bobbitt, Heywood Iles, MD  loratadine (CLARITIN) 10 MG tablet Take 10 mg by mouth daily. PRN  10/28/20  [provider]    Family History Family History  Problem Relation Age of Onset  . Asthma Neg Hx   . Allergic rhinitis Neg Hx   .  Angioedema Neg Hx   . Eczema Neg Hx   . Immunodeficiency Neg Hx   . Urticaria Neg Hx     Social History Social History   Tobacco Use  . Smoking status: Former Smoker    Years: 5.00    Types: Cigarettes    Quit date: 05/29/1990    Years since quitting: 30.4  . Smokeless tobacco: Never Used  Vaping Use  . Vaping Use: Never used  Substance Use Topics  . Alcohol use: No    Comment: rare occasion  . Drug use: No     Allergies   Patient has no known allergies.   Review of Systems Review of Systems  Musculoskeletal: Negative for gait problem.  Skin: Positive for rash.       + pruritus     Physical Exam Triage Vital  Signs ED Triage Vitals [10/28/20 1401]  Enc Vitals Group     BP 138/81     Pulse Rate 78     Resp 18     Temp 98.1 F (36.7 C)     Temp Source Oral     SpO2 95 %     Weight      Height      Head Circumference      Peak Flow      Pain Score 0     Pain Loc      Pain Edu?      Excl. in GC?    No data found.  Updated Vital Signs BP 138/81 (BP Location: Right Arm)   Pulse 78   Temp 98.1 F (36.7 C) (Oral)   Resp 18   SpO2 95%   Visual Acuity Right Eye Distance:   Left Eye Distance:   Bilateral Distance:    Right Eye Near:   Left Eye Near:    Bilateral Near:     Physical Exam Vitals and nursing note reviewed.  HENT:     Head: Normocephalic.     Right Ear: External ear normal.     Left Ear: External ear normal.  Eyes:     General: No scleral icterus.    Conjunctiva/sclera: Conjunctivae normal.  Pulmonary:     Effort: Pulmonary effort is normal.  Musculoskeletal:        General: Normal range of motion.     Cervical back: Neck supple.  Skin:    General: Skin is warm and dry.     Findings: Rash present.     Comments: Rash present on L face, arms back of ear and front of the ear typical look of poison IV  Neurological:     Mental Status: She is alert and oriented to person, place, and time.     Gait: Gait normal.  Psychiatric:        Mood and Affect: Mood normal.        Behavior: Behavior normal.        Thought Content: Thought content normal.        Judgment: Judgment normal.      UC Treatments / Results  Labs (all labs ordered are listed, but only abnormal results are displayed) Labs Reviewed - No data to display  EKG   Radiology No results found.  Procedures Procedures (including critical care time)  Medications Ordered in UC Medications - No data to display  Initial Impression / Assessment and Plan / UC Course  I have reviewed the triage vital signs and the nursing notes. Has Poison IV dermatitis. I  placed her on Medrol dose pack. See  instructions   Final Clinical Impressions(s) / UC Diagnoses   Final diagnoses:  Poison ivy     Discharge Instructions     You may get a wash TECNU to wash the oils of the plant    ED Prescriptions    Medication Sig Dispense Auth. Provider   methylPREDNISolone (MEDROL DOSEPAK) 4 MG TBPK tablet Take as directed 21 tablet Rodriguez-Southworth, Nettie Elm, PA-C     PDMP not reviewed this encounter.   Garey Ham, Cordelia Poche 10/28/20 2120

## 2020-12-02 ENCOUNTER — Other Ambulatory Visit: Payer: Self-pay | Admitting: Physician Assistant

## 2020-12-02 DIAGNOSIS — Z1231 Encounter for screening mammogram for malignant neoplasm of breast: Secondary | ICD-10-CM

## 2021-03-25 ENCOUNTER — Encounter (HOSPITAL_COMMUNITY): Payer: Self-pay | Admitting: Emergency Medicine

## 2021-03-25 ENCOUNTER — Emergency Department (HOSPITAL_COMMUNITY): Payer: BC Managed Care – PPO

## 2021-03-25 ENCOUNTER — Emergency Department (HOSPITAL_COMMUNITY)
Admission: EM | Admit: 2021-03-25 | Discharge: 2021-03-26 | Disposition: A | Discharge status: Home / Self Care | Payer: BC Managed Care – PPO | Attending: Emergency Medicine | Admitting: Emergency Medicine

## 2021-03-25 DIAGNOSIS — Z87891 Personal history of nicotine dependence: Secondary | ICD-10-CM | POA: Insufficient documentation

## 2021-03-25 DIAGNOSIS — R200 Anesthesia of skin: Secondary | ICD-10-CM | POA: Insufficient documentation

## 2021-03-25 DIAGNOSIS — Z79899 Other long term (current) drug therapy: Secondary | ICD-10-CM | POA: Insufficient documentation

## 2021-03-25 DIAGNOSIS — R519 Headache, unspecified: Secondary | ICD-10-CM | POA: Diagnosis not present

## 2021-03-25 LAB — DIFFERENTIAL
Abs Immature Granulocytes: 0.05 10*3/uL (ref 0.00–0.07)
Basophils Absolute: 0.1 10*3/uL (ref 0.0–0.1)
Basophils Relative: 1 %
Eosinophils Absolute: 0.1 10*3/uL (ref 0.0–0.5)
Eosinophils Relative: 1 %
Immature Granulocytes: 1 %
Lymphocytes Relative: 24 %
Lymphs Abs: 2.4 10*3/uL (ref 0.7–4.0)
Monocytes Absolute: 1 10*3/uL (ref 0.1–1.0)
Monocytes Relative: 10 %
Neutro Abs: 6.4 10*3/uL (ref 1.7–7.7)
Neutrophils Relative %: 63 %

## 2021-03-25 LAB — COMPREHENSIVE METABOLIC PANEL
ALT: 21 U/L (ref 0–44)
AST: 16 U/L (ref 15–41)
Albumin: 3.9 g/dL (ref 3.5–5.0)
Alkaline Phosphatase: 80 U/L (ref 38–126)
Anion gap: 8 (ref 5–15)
BUN: 20 mg/dL (ref 6–20)
CO2: 27 mmol/L (ref 22–32)
Calcium: 8.7 mg/dL — ABNORMAL LOW (ref 8.9–10.3)
Chloride: 103 mmol/L (ref 98–111)
Creatinine, Ser: 0.8 mg/dL (ref 0.44–1.00)
GFR, Estimated: >60 mL/min (ref >60)
Glucose, Bld: 100 mg/dL — ABNORMAL HIGH (ref 70–99)
Potassium: 3.4 mmol/L — ABNORMAL LOW (ref 3.5–5.1)
Sodium: 138 mmol/L (ref 135–145)
Total Bilirubin: 0.3 mg/dL (ref 0.3–1.2)
Total Protein: 7.3 g/dL (ref 6.5–8.1)

## 2021-03-25 LAB — CBC
HCT: 39.2 % (ref 36.0–46.0)
Hemoglobin: 13 g/dL (ref 12.0–15.0)
MCH: 29.1 pg (ref 26.0–34.0)
MCHC: 33.2 g/dL (ref 30.0–36.0)
MCV: 87.9 fL (ref 80.0–100.0)
Platelets: 290 10*3/uL (ref 150–400)
RBC: 4.46 MIL/uL (ref 3.87–5.11)
RDW: 13.3 % (ref 11.5–15.5)
WBC: 10 10*3/uL (ref 4.0–10.5)
nRBC: 0 % (ref 0.0–0.2)

## 2021-03-25 LAB — PROTIME-INR
INR: 0.9 (ref 0.8–1.2)
Prothrombin Time: 12 seconds (ref 11.4–15.2)

## 2021-03-25 LAB — APTT: aPTT: 26 seconds (ref 24–36)

## 2021-03-25 NOTE — ED Triage Notes (Signed)
LT side of face feels numb x 1 hour ago.  Pt had brain surgery in December of last year for brain bleed.

## 2021-03-25 NOTE — Discharge Instructions (Signed)
There is no sign of intracranial bleeding.  The lumps and bumps you are feeling behind her left ear are likely related to your prior surgery.  There do not appear to be any complications from the surgical site that show up on the CAT scan.  Follow-up with your primary care doctor for further care and treatment.

## 2021-03-25 NOTE — ED Notes (Signed)
Pt returned from CT scan.

## 2021-03-25 NOTE — ED Provider Notes (Signed)
[facility] EMERGENCY DEPARTMENT Provider Note   CSN: [id number] Arrival date & time: 03/25/21  2014     History No chief complaint on file.   [name] is a 55 y.o. female.  HPI She presents for evaluation of ongoing headaches since she had a craniotomy for AVM, in December 2021.  Also, she has noticed some numbness in the left side of her face, that started about an hour before arrival to the ED.  She denies fever, chills, blurred vision, loss of hearing, loss of taste, weakness or paresthesia.  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  There are no other known active modifying factors    Past Medical History:  Diagnosis Date   ADD (attention deficit disorder)    Sleep apnea    no cpap, sleep study 11-24-2010 on chart    Patient Active Problem List   Diagnosis Date Noted   Cough, persistent 10/03/2017   Seasonal and perennial allergic rhinitis 10/03/2017   Wheezing/coughing 10/03/2017   Recurrent respiratory infections 10/03/2017   Multiple thyroid nodules 03/15/2015   Abdominal pain, chronic, right lower quadrant 11/20/2011   Rectal bleeding 11/20/2011    Past Surgical History:  Procedure Laterality Date   BRAIN SURGERY  2021   CESAREAN SECTION  05/30/1987   HERNIA REPAIR  05/30/2011   Lap Ventral Hernia Repair   PARTIAL HYSTERECTOMY  05/29/2010   TUBAL LIGATION  05/29/1990   VENTRAL HERNIA REPAIR  01/09/2012   Procedure: LAPAROSCOPIC VENTRAL HERNIA;  Surgeon: Mariella Saa, MD;  Location: [facility];  Service: General;  Laterality: N/A;  Laparoscopic Ventral Hernia Repair   WISDOM TOOTH EXTRACTION  05/29/2001     OB History   No obstetric history on file.     Family History  Problem Relation Age of Onset   Asthma Neg Hx    Allergic rhinitis Neg Hx    Angioedema Neg Hx    Eczema Neg Hx    Immunodeficiency Neg Hx    Urticaria Neg Hx     Social History   Tobacco Use   Smoking status: Former    Years: 5.00    Types: Cigarettes    Quit  date: 05/29/1990    Years since quitting: 30.8   Smokeless tobacco: Never  Vaping Use   Vaping Use: Never used  Substance Use Topics   Alcohol use: No    Comment: rare occasion   Drug use: No    Home Medications Prior to Admission medications   Medication Sig Start Date End Date Taking? Authorizing Provider  albuterol (PROAIR HFA) 108 (90 Base) MCG/ACT inhaler Inhale 2 puffs into the lungs every 4 (four) hours as needed for wheezing or shortness of breath. 10/03/17   Bobbitt, Heywood Iles, MD  atorvastatin (LIPITOR) 40 MG tablet Take 40 mg by mouth daily.    [provider]  Carbinoxamine Maleate 4 MG TABS Take 1 tablet (4 mg total) by mouth every 8 (eight) hours as needed. 10/03/17   Bobbitt, Heywood Iles, MD  escitalopram (LEXAPRO) 10 MG tablet Take 10 mg by mouth daily.    [provider]  fluticasone (FLOVENT HFA) 220 MCG/ACT inhaler Inhale 2 puffs into the lungs 2 (two) times daily. Patient taking differently: Inhale 2 puffs into the lungs 2 (two) times daily. PRN 10/03/17   Bobbitt, Heywood Iles, MD  Fluticasone Propionate Timmothy Sours) 93 MCG/ACT EXHU Place 2 puffs into the nose 2 (two) times daily. Patient taking differently: Place 2 puffs into the  nose 2 (two) times daily. PRN 10/03/17   Bobbitt, Heywood Iles, MD  levETIRAcetam (KEPPRA) 500 MG tablet Take 500 mg by mouth 2 (two) times daily.    [provider]  lisinopril (ZESTRIL) 10 MG tablet Take 10 mg by mouth daily.    [provider]  methylphenidate (CONCERTA) 54 MG CR tablet Take 27 mg by mouth every morning.    [provider]  methylPREDNISolone (MEDROL DOSEPAK) 4 MG TBPK tablet Take as directed 10/28/20   Rodriguez-Southworth, Nettie Elm, PA-C  montelukast (SINGULAIR) 10 MG tablet Take 1 tablet (10 mg total) by mouth at bedtime. Patient taking differently: Take 10 mg by mouth at bedtime. PRN 10/03/17   Bobbitt, Heywood Iles, MD  loratadine (CLARITIN) 10 MG tablet Take 10 mg by mouth daily. PRN   10/28/20  [provider]    Allergies    Patient has no known allergies.  Review of Systems   Review of Systems  All other systems reviewed and are negative.  Physical Exam Updated Vital Signs BP 122/75   Pulse (!) 51   Temp 98.8 F (37.1 C)   Resp 14   Ht 5\' 3"  (1.6 m)   Wt 124.7 kg   SpO2 98%   BMI 48.71 kg/m   Physical Exam Vitals and nursing note reviewed.  Constitutional:      General: She is not in acute distress.    Appearance: She is well-developed. She is not ill-appearing, toxic-appearing or diaphoretic.  HENT:     Head: Normocephalic and atraumatic.     Nose: No congestion.     Mouth/Throat:     Pharynx: No oropharyngeal exudate.  Eyes:     Conjunctiva/sclera: Conjunctivae normal.     Pupils: Pupils are equal, round, and reactive to light.  Neck:     Trachea: Phonation normal.  Cardiovascular:     Rate and Rhythm: Normal rate and regular rhythm.  Pulmonary:     Effort: Pulmonary effort is normal.     Breath sounds: Normal breath sounds.  Chest:     Chest wall: No tenderness.  Abdominal:     General: There is no distension.     Palpations: Abdomen is soft.     Tenderness: There is no abdominal tenderness. There is no guarding.  Musculoskeletal:        General: Normal range of motion.     Cervical back: Normal range of motion and neck supple.  Skin:    General: Skin is warm and dry.  Neurological:     Mental Status: She is alert and oriented to person, place, and time.     Motor: No abnormal muscle tone.     Comments: No dysarthria, aphasia or nystagmus.  Mild light test dysesthesia, limited to the left mid face.  No other focal neurologic abnormality.  Psychiatric:        Mood and Affect: Mood normal.        Behavior: Behavior normal.        Thought Content: Thought content normal.        Judgment: Judgment normal.    ED Results / Procedures / Treatments   Labs (all labs ordered are listed, but only abnormal results are  displayed) Labs Reviewed  COMPREHENSIVE METABOLIC PANEL - Abnormal; Notable for the following components:      Result Value   Potassium 3.4 (*)    Glucose, Bld 100 (*)    Calcium 8.7 (*)    All other components within normal  limits  PROTIME-INR  APTT  CBC  DIFFERENTIAL  CBG MONITORING, ED    EKG EKG Interpretation  Date/Time:  Friday March 25 2021 20:35:13 EDT Ventricular Rate:  72 PR Interval:  152 QRS Duration: 102 QT Interval:  417 QTC Calculation: 457 R Axis:   77 Text Interpretation: Sinus rhythm Confirmed by Mancel Bale [phone]) on 03/25/2021 9:18:08 PM  Radiology CT Head Wo Contrast  Result Date: 03/25/2021 CLINICAL DATA:  Left-sided head and facial numbness for 1 day, history of prior aneurysm EXAM: CT HEAD WITHOUT CONTRAST TECHNIQUE: Contiguous axial images were obtained from the base of the skull through the vertex without intravenous contrast. COMPARISON:  05/06/2020 FINDINGS: Brain: Left temporal encephalomalacia identified consistent with prior intracranial hemorrhage. Underlying aneurysm clip is noted. No signs of acute infarct or hemorrhage. Lateral ventricles and midline structures are unremarkable. No acute extra-axial fluid collections. No mass effect. Vascular: No hyperdense vessel or unexpected calcification. Skull: Postsurgical changes from left temporal craniotomy. The remainder of the calvarium is unremarkable. Sinuses/Orbits: Polypoid mucosal thickening left maxillary sinus unchanged. Remaining paranasal sinuses are clear. Other: None. IMPRESSION: 1. Postsurgical changes from left temporal craniotomy and aneurysm clipping. Minimal encephalomalacia. 2. No acute intracranial process. Electronically Signed   By: Sharlet Salina M.D.   On: 03/25/2021 21:03    Procedures Procedures   Medications Ordered in ED Medications - No data to display  ED Course  I have reviewed the triage vital signs and the nursing notes.  Pertinent labs & imaging results that  were available during my care of the patient were reviewed by me and considered in my medical decision making (see chart for details).  Clinical Course as of 03/26/21 0019  Fri Mar 25, 2021  2025 Clinical evaluation does not meet criteria for code stroke. [EW]    Clinical Course User Index [EW] Mancel Bale, MD   MDM Rules/Calculators/A&P                            Patient Vitals for the past 24 hrs:  BP Temp Pulse Resp SpO2 Height Weight  03/26/21 0000 -- -- (!) 51 14 98 % -- --  03/25/21 2300 122/75 -- (!) 59 12 100 % -- --  03/25/21 2230 122/64 -- 64 14 99 % -- --  03/25/21 2200 (!) 120/52 -- 62 13 97 % -- --  03/25/21 2130 129/66 -- (!) 58 16 98 % -- --  03/25/21 2100 (!) 146/82 -- 68 14 97 % -- --  03/25/21 2022 (!) 166/113 98.8 F (37.1 C) 70 18 100 % -- --  03/25/21 2022 -- -- -- -- -- 5\' 3"  (1.6 m) 124.7 kg    11:30 PM Reevaluation with update and discussion. After initial assessment and treatment, an updated evaluation reveals she states the facial numbness is improving.  She has no other complaints.  Findings discussed with patient and her husband, all questions answered. Mancel Bale   Medical Decision Making:  This patient is presenting for evaluation of headache and facial numbness, which does require a range of treatment options, and is a complaint that involves a moderate risk of morbidity and mortality. The differential diagnoses include peripheral neuropathy, tension headache, postoperative pain. I decided to review old records, and in summary elderly female with ongoing symptoms since craniotomy, 11 months ago, presenting now with facial numbness.  I obtained additional historical information from husband at bedside.  Clinical Laboratory Tests Ordered, included CBC, Metabolic  panel, and PT/INR, PTT . Review indicates no acute abnormalities. Radiologic Tests Ordered, included CT head.  I independently Visualized: CT images, which show no acute  abnormalities    Critical Interventions-clinical evaluation, laboratory testing, CT imaging, observation and reassessment  After These Interventions, the Patient was reevaluated and was found stable for discharge.  No change in clinical status.  Doubt intracranial bleeding, complications from AVM repair or CVA.  CRITICAL CARE-no indication for further ED evaluation or intervention. Performed by: Mancel Bale  Nursing Notes Reviewed/ Care Coordinated Applicable Imaging Reviewed Interpretation of Laboratory Data incorporated into ED treatment  The patient appears reasonably screened and/or stabilized for discharge and I doubt any other medical condition or other [facility] requiring further screening, evaluation, or treatment in the ED at this time prior to discharge.  Plan: Home Medications-continue usual; Home Treatments-rest, fluids, gradual advance activity; return here if the recommended treatment, does not improve the symptoms; Recommended follow up-PCP and neurosurgery as needed     Final Clinical Impression(s) / ED Diagnoses Final diagnoses:  Nonintractable headache, unspecified chronicity pattern, unspecified headache type  Facial numbness    Rx / DC Orders ED Discharge Orders     None        Mancel Bale, MD 03/26/21 [phone]

## 2021-03-25 NOTE — ED Notes (Signed)
Edp notified of pt, given VO to order head CT

## 2021-03-26 LAB — CBG MONITORING, ED: Glucose-Capillary: 117 mg/dL — ABNORMAL HIGH (ref 70–99)

## 2021-06-14 ENCOUNTER — Encounter: Payer: Self-pay | Admitting: Emergency Medicine

## 2021-06-14 ENCOUNTER — Ambulatory Visit
Admission: EM | Admit: 2021-06-14 | Discharge: 2021-06-14 | Disposition: A | Discharge status: Home / Self Care | Payer: BC Managed Care – PPO | Attending: Student | Admitting: Student

## 2021-06-14 ENCOUNTER — Other Ambulatory Visit: Payer: Self-pay

## 2021-06-14 DIAGNOSIS — J069 Acute upper respiratory infection, unspecified: Secondary | ICD-10-CM

## 2021-06-14 DIAGNOSIS — H6593 Unspecified nonsuppurative otitis media, bilateral: Secondary | ICD-10-CM

## 2021-06-14 DIAGNOSIS — Z9189 Other specified personal risk factors, not elsewhere classified: Secondary | ICD-10-CM | POA: Diagnosis not present

## 2021-06-14 DIAGNOSIS — Z20822 Contact with and (suspected) exposure to covid-19: Secondary | ICD-10-CM

## 2021-06-14 DIAGNOSIS — Z1152 Encounter for screening for COVID-19: Secondary | ICD-10-CM

## 2021-06-14 HISTORY — DX: Essential (primary) hypertension: I10

## 2021-06-14 MED ORDER — PREDNISONE 20 MG PO TABS: 20.0000 mg | ORAL_TABLET | Freq: Every day | ORAL | 0 refills | Status: AC

## 2021-06-14 NOTE — ED Triage Notes (Signed)
Sore throat, feels bad, headache, bilateral ear pain since Sunday.

## 2021-06-14 NOTE — ED Provider Notes (Signed)
[facility]    CSN: [id number] Arrival date & time: 06/14/21  1340      History   Chief Complaint No chief complaint on file.   HPI [name] is a 56 y.o. female presenting with sore throat, ear pain, headaches for 4 days.  Exposure to covid. Medical history brain surgery 1 year ago due to AVM. Symptoms overall improving but she still feels "gunky." Denies SOB, CP, fevers/chills. No pulm ds- doesn't use albuterol. Hasn't attempted medications at home. States she had brain surgery 1 year ago and ears have felt strange since then.   HPI  Past Medical History:  Diagnosis Date   ADD (attention deficit disorder)    Hypertension    Sleep apnea    no cpap, sleep study 11-24-2010 on chart    Patient Active Problem List   Diagnosis Date Noted   Cough, persistent 10/03/2017   Seasonal and perennial allergic rhinitis 10/03/2017   Wheezing/coughing 10/03/2017   Recurrent respiratory infections 10/03/2017   Multiple thyroid nodules 03/15/2015   Abdominal pain, chronic, right lower quadrant 11/20/2011   Rectal bleeding 11/20/2011    Past Surgical History:  Procedure Laterality Date   BRAIN SURGERY  2021   CESAREAN SECTION  05/30/1987   HERNIA REPAIR  05/30/2011   Lap Ventral Hernia Repair   PARTIAL HYSTERECTOMY  05/29/2010   TUBAL LIGATION  05/29/1990   VENTRAL HERNIA REPAIR  01/09/2012   Procedure: LAPAROSCOPIC VENTRAL HERNIA;  Surgeon: Edward Jolly, MD;  Location: [facility];  Service: General;  Laterality: N/A;  Laparoscopic Ventral Hernia Repair   WISDOM TOOTH EXTRACTION  05/29/2001    OB History   No obstetric history on file.      Home Medications    Prior to Admission medications   Medication Sig Start Date End Date Taking? Authorizing Provider  predniSONE (DELTASONE) 20 MG tablet Take 1 tablet (20 mg total) by mouth daily for 5 days. 06/14/21 06/19/21 Yes Hazel Sams, PA-C  atorvastatin (LIPITOR) 40 MG tablet Take 40 mg by mouth daily.     [provider]  Carbinoxamine Maleate 4 MG TABS Take 1 tablet (4 mg total) by mouth every 8 (eight) hours as needed. 10/03/17   Bobbitt, Sedalia Muta, MD  escitalopram (LEXAPRO) 10 MG tablet Take 10 mg by mouth daily.    [provider]  fluticasone (FLOVENT HFA) 220 MCG/ACT inhaler Inhale 2 puffs into the lungs 2 (two) times daily. Patient taking differently: Inhale 2 puffs into the lungs 2 (two) times daily. PRN 10/03/17   Bobbitt, Sedalia Muta, MD  Fluticasone Propionate Truett Perna) 93 MCG/ACT EXHU Place 2 puffs into the nose 2 (two) times daily. Patient taking differently: Place 2 puffs into the nose 2 (two) times daily. PRN 10/03/17   Bobbitt, Sedalia Muta, MD  lisinopril (ZESTRIL) 10 MG tablet Take 10 mg by mouth daily.    [provider]  methylphenidate (CONCERTA) 54 MG CR tablet Take 27 mg by mouth every morning.    [provider]  montelukast (SINGULAIR) 10 MG tablet Take 1 tablet (10 mg total) by mouth at bedtime. Patient taking differently: Take 10 mg by mouth at bedtime. PRN 10/03/17   Bobbitt, Sedalia Muta, MD  loratadine (CLARITIN) 10 MG tablet Take 10 mg by mouth daily. PRN  10/28/20  [provider]    Family History Family History  Problem Relation Age of Onset   Asthma Neg Hx    Allergic rhinitis Neg Hx  Angioedema Neg Hx    Eczema Neg Hx    Immunodeficiency Neg Hx    Urticaria Neg Hx     Social History Social History   Tobacco Use   Smoking status: Former    Years: 5.00    Types: Cigarettes    Quit date: 05/29/1990    Years since quitting: 31.0   Smokeless tobacco: Never  Vaping Use   Vaping Use: Never used  Substance Use Topics   Alcohol use: No    Comment: rare occasion   Drug use: No     Allergies   Patient has no known allergies.   Review of Systems Review of Systems  Constitutional:  Negative for appetite change, chills and fever.  HENT:  Positive for ear pain and sore throat. Negative for congestion,  rhinorrhea, sinus pressure and sinus pain.   Eyes:  Negative for redness and visual disturbance.  Respiratory:  Negative for cough, chest tightness, shortness of breath and wheezing.   Cardiovascular:  Negative for chest pain and palpitations.  Gastrointestinal:  Negative for abdominal pain, constipation, diarrhea, nausea and vomiting.  Genitourinary:  Negative for dysuria, frequency and urgency.  Musculoskeletal:  Negative for myalgias.  Neurological:  Negative for dizziness, weakness and headaches.  Psychiatric/Behavioral:  Negative for confusion.   All other systems reviewed and are negative.   Physical Exam Triage Vital Signs ED Triage Vitals  Enc Vitals Group     BP 06/14/21 1354 132/87     Pulse Rate 06/14/21 1354 96     Resp 06/14/21 1354 18     Temp 06/14/21 1354 (!) 97.5 F (36.4 C)     Temp Source 06/14/21 1354 Oral     SpO2 06/14/21 1354 96 %     Weight --      Height --      Head Circumference --      Peak Flow --      Pain Score 06/14/21 1355 0     Pain Loc --      Pain Edu? --      Excl. in [place]? --    No data found.  Updated Vital Signs BP 132/87 (BP Location: Right Arm)    Pulse 96    Temp (!) 97.5 F (36.4 C) (Oral)    Resp 18    SpO2 96%   Visual Acuity Right Eye Distance:   Left Eye Distance:   Bilateral Distance:    Right Eye Near:   Left Eye Near:    Bilateral Near:     Physical Exam Vitals reviewed.  Constitutional:      Appearance: Normal appearance. She is not ill-appearing.  HENT:     Head: Normocephalic and atraumatic.     Right Ear: Hearing, ear canal and external ear normal. No swelling or tenderness. A middle ear effusion is present. There is no impacted cerumen. No mastoid tenderness. Tympanic membrane is not injected, scarred, perforated, erythematous, retracted or bulging.     Left Ear: Hearing, ear canal and external ear normal. No swelling or tenderness. A middle ear effusion is present. There is no impacted cerumen. No mastoid  tenderness. Tympanic membrane is not injected, scarred, perforated, erythematous, retracted or bulging.     Mouth/Throat:     Pharynx: Oropharynx is clear. No oropharyngeal exudate or posterior oropharyngeal erythema.  Cardiovascular:     Rate and Rhythm: Normal rate and regular rhythm.     Heart sounds: Normal heart sounds.  Pulmonary:  Effort: Pulmonary effort is normal.     Breath sounds: Normal breath sounds.  Lymphadenopathy:     Cervical: No cervical adenopathy.  Neurological:     General: No focal deficit present.     Mental Status: She is alert and oriented to person, place, and time.  Psychiatric:        Mood and Affect: Mood normal.        Behavior: Behavior normal.        Thought Content: Thought content normal.        Judgment: Judgment normal.     UC Treatments / Results  Labs (all labs ordered are listed, but only abnormal results are displayed) Labs Reviewed  COVID-19, FLU A+B NAA    EKG   Radiology No results found.  Procedures Procedures (including critical care time)  Medications Ordered in UC Medications - No data to display  Initial Impression / Assessment and Plan / UC Course  I have reviewed the triage vital signs and the nursing notes.  Pertinent labs & imaging results that were available during my care of the patient were reviewed by me and considered in my medical decision making (see chart for details).     This patient is a very pleasant 56 y.o. year old female presenting with viral syndrome following covid exposure x4 days. Afebrile, nontachy.  Exposure to covid, Covid and influenza PCR sent.  Mild symptoms, no pulm ds, not candidate for antiviral.   Low-dose prednisone sent. She has flonase at home already.   ED return precautions discussed. Patient verbalizes understanding and agreement.     Final Clinical Impressions(s) / UC Diagnoses   Final diagnoses:  At increased risk of exposure to COVID-19 virus  Viral URI with  cough  Encounter for screening for COVID-19  Exposure to confirmed case of COVID-19  Fluid level behind tympanic membrane of both ears     Discharge Instructions      -Prednisone once daily x5 days. Take in the morning with food.  -Flonase nasal steroid: place 2 sprays into both nostrils in the morning and at bedtime for at least 7 days. Continue for longer if this is helping. -With a virus, you're typically contagious for 5-7 days, or as long as you're having fevers.       ED Prescriptions     Medication Sig Dispense Auth. Provider   predniSONE (DELTASONE) 20 MG tablet Take 1 tablet (20 mg total) by mouth daily for 5 days. 5 tablet Hazel Sams, PA-C      PDMP not reviewed this encounter.   Shaton, [place], PA-C 06/14/21 1427

## 2021-06-14 NOTE — Discharge Instructions (Addendum)
-  Prednisone once daily x5 days. Take in the morning with food.  -Flonase nasal steroid: place 2 sprays into both nostrils in the morning and at bedtime for at least 7 days. Continue for longer if this is helping. -With a virus, you're typically contagious for 5-7 days, or as long as you're having fevers.

## 2021-06-15 LAB — COVID-19, FLU A+B NAA
Influenza A, NAA: NOT DETECTED
Influenza B, NAA: NOT DETECTED
SARS-CoV-2, NAA: NOT DETECTED

## 2022-01-11 ENCOUNTER — Other Ambulatory Visit (HOSPITAL_COMMUNITY): Payer: Self-pay | Admitting: Physician Assistant

## 2022-01-11 DIAGNOSIS — J209 Acute bronchitis, unspecified: Secondary | ICD-10-CM

## 2022-01-12 ENCOUNTER — Ambulatory Visit (HOSPITAL_COMMUNITY)
Admission: RE | Admit: 2022-01-12 | Discharge: 2022-01-12 | Disposition: A | Discharge status: Home / Self Care | Payer: BC Managed Care – PPO | Source: Ambulatory Visit | Attending: Physician Assistant | Admitting: Physician Assistant

## 2022-01-12 DIAGNOSIS — J209 Acute bronchitis, unspecified: Secondary | ICD-10-CM | POA: Insufficient documentation

## 2022-06-15 ENCOUNTER — Emergency Department (HOSPITAL_BASED_OUTPATIENT_CLINIC_OR_DEPARTMENT_OTHER): Payer: BC Managed Care – PPO

## 2022-06-15 ENCOUNTER — Encounter (HOSPITAL_BASED_OUTPATIENT_CLINIC_OR_DEPARTMENT_OTHER): Payer: Self-pay | Admitting: Emergency Medicine

## 2022-06-15 ENCOUNTER — Other Ambulatory Visit: Payer: Self-pay

## 2022-06-15 ENCOUNTER — Emergency Department (HOSPITAL_BASED_OUTPATIENT_CLINIC_OR_DEPARTMENT_OTHER)
Admission: EM | Admit: 2022-06-15 | Discharge: 2022-06-15 | Disposition: A | Discharge status: Home / Self Care | Payer: BC Managed Care – PPO | Attending: Emergency Medicine | Admitting: Emergency Medicine

## 2022-06-15 DIAGNOSIS — Z79899 Other long term (current) drug therapy: Secondary | ICD-10-CM | POA: Diagnosis not present

## 2022-06-15 DIAGNOSIS — R519 Headache, unspecified: Secondary | ICD-10-CM | POA: Diagnosis present

## 2022-06-15 DIAGNOSIS — I10 Essential (primary) hypertension: Secondary | ICD-10-CM | POA: Diagnosis not present

## 2022-06-15 HISTORY — DX: Arteriovenous malformation, site unspecified: Q27.30

## 2022-06-15 LAB — CBC WITH DIFFERENTIAL/PLATELET
Abs Immature Granulocytes: 0.04 10*3/uL (ref 0.00–0.07)
Basophils Absolute: 0.1 10*3/uL (ref 0.0–0.1)
Basophils Relative: 1 %
Eosinophils Absolute: 0.1 10*3/uL (ref 0.0–0.5)
Eosinophils Relative: 2 %
HCT: 42.9 % (ref 36.0–46.0)
Hemoglobin: 14.1 g/dL (ref 12.0–15.0)
Immature Granulocytes: 0 %
Lymphocytes Relative: 18 %
Lymphs Abs: 1.6 10*3/uL (ref 0.7–4.0)
MCH: 28.4 pg (ref 26.0–34.0)
MCHC: 32.9 g/dL (ref 30.0–36.0)
MCV: 86.5 fL (ref 80.0–100.0)
Monocytes Absolute: 0.8 10*3/uL (ref 0.1–1.0)
Monocytes Relative: 8 %
Neutro Abs: 6.6 10*3/uL (ref 1.7–7.7)
Neutrophils Relative %: 71 %
Platelets: 285 10*3/uL (ref 150–400)
RBC: 4.96 MIL/uL (ref 3.87–5.11)
RDW: 14.1 % (ref 11.5–15.5)
WBC: 9.2 10*3/uL (ref 4.0–10.5)
nRBC: 0 % (ref 0.0–0.2)

## 2022-06-15 LAB — BASIC METABOLIC PANEL
Anion gap: 10 (ref 5–15)
BUN: 25 mg/dL — ABNORMAL HIGH (ref 6–20)
CO2: 22 mmol/L (ref 22–32)
Calcium: 9.4 mg/dL (ref 8.9–10.3)
Chloride: 108 mmol/L (ref 98–111)
Creatinine, Ser: 0.88 mg/dL (ref 0.44–1.00)
GFR, Estimated: >60 mL/min (ref >60)
Glucose, Bld: 94 mg/dL (ref 70–99)
Potassium: 4.3 mmol/L (ref 3.5–5.1)
Sodium: 140 mmol/L (ref 135–145)

## 2022-06-15 NOTE — Discharge Instructions (Signed)
As we discussed, please measure your blood pressure in the morning in the evening and keep a log.  Continue taking your medication as prescribed.  I would like for you to follow-up with your primary care doctor for further evaluation.  He may return to the emergency department any time for worsening symptoms.

## 2022-06-15 NOTE — ED Notes (Signed)
Discharge instructions and follow up care reviewed and explained, pt verbalized understanding and had no further questions on d/c. Pt caox4 and ambulatory on d/c.  

## 2022-06-15 NOTE — ED Triage Notes (Signed)
Pt has been having episodes of high blood pressure, headaches as well for 2 weeks. Went to primary for the headache, was given a steroid shot and an antiinflammatory. 2 years ago had an AVM ,had it operated on. The symptoms prior to finding this was the headaches. Pt's doctor scheduled a scan for next week, but told to come today due to symptoms.

## 2022-06-15 NOTE — ED Provider Notes (Signed)
[facility] Provider Note   CSN: [id number] Arrival date & time: 06/15/22  1040     History Chief Complaint  Patient presents with   Hypertension    [name] is a 57 y.o. female with history of hypertension on lisinopril daily and history of craniotomy secondary to AVM 2 years ago who presents to the emergency department with elevated blood pressure readings at home over the last 5 days.  Patient was initially seen and evaluated by her primary care doctor yesterday secondary to these symptoms.  Patient had a negative neurological exam per patient in the office and is scheduled to see her neurosurgeon on Monday.  Patient reports associated headache over the last 4 to 5 days.  She denies any focal weakness, focal numbness, facial droop, urinary incontinence, unsteady gait.  Patient does endorse some nausea without vomiting.  She states that she took her blood pressure this morning and it was 220/117.   Hypertension       Home Medications Prior to Admission medications   Medication Sig Start Date End Date Taking? Authorizing Provider  atorvastatin (LIPITOR) 40 MG tablet Take 40 mg by mouth daily.    [provider]  Carbinoxamine Maleate 4 MG TABS Take 1 tablet (4 mg total) by mouth every 8 (eight) hours as needed. 10/03/17   Bobbitt, Sedalia Muta, MD  escitalopram (LEXAPRO) 10 MG tablet Take 10 mg by mouth daily.    [provider]  fluticasone (FLOVENT HFA) 220 MCG/ACT inhaler Inhale 2 puffs into the lungs 2 (two) times daily. Patient taking differently: Inhale 2 puffs into the lungs 2 (two) times daily. PRN 10/03/17   Bobbitt, Sedalia Muta, MD  Fluticasone Propionate Truett Perna) 93 MCG/ACT EXHU Place 2 puffs into the nose 2 (two) times daily. Patient taking differently: Place 2 puffs into the nose 2 (two) times daily. PRN 10/03/17   Bobbitt, Sedalia Muta, MD  lisinopril (ZESTRIL) 10 MG tablet Take 10 mg by mouth daily.    [provider]  methylphenidate (CONCERTA) 54 MG CR tablet Take 27 mg by mouth every morning.    [provider]  methylphenidate 27 MG PO CR tablet Take 27 mg by mouth every morning. 05/12/22   [provider]  montelukast (SINGULAIR) 10 MG tablet Take 1 tablet (10 mg total) by mouth at bedtime. Patient taking differently: Take 10 mg by mouth at bedtime. PRN 10/03/17   Bobbitt, Sedalia Muta, MD  progesterone (PROMETRIUM) 100 MG capsule Take 100 mg by mouth daily. 05/24/22   [provider]  topiramate (TOPAMAX) 50 MG tablet Take by mouth. 06/06/22   [provider]  loratadine (CLARITIN) 10 MG tablet Take 10 mg by mouth daily. PRN  10/28/20  [provider]      Allergies    Patient has no known allergies.    Review of Systems   Review of Systems  All other systems reviewed and are negative.   Physical Exam Updated Vital Signs BP (!) 129/93   Pulse 62   Temp 98.8 F (37.1 C)   Resp 13   SpO2 99%  Physical Exam Vitals and nursing note reviewed.  Constitutional:      General: She is not in acute distress.    Appearance: Normal appearance.  HENT:     Head: Normocephalic and atraumatic.  Eyes:     General:        Right eye: No discharge.        Left  eye: No discharge.  Cardiovascular:     Comments: Regular rate and rhythm.  S1/S2 are distinct without any evidence of murmur, rubs, or gallops.  Radial pulses are 2+ bilaterally.  Dorsalis pedis pulses are 2+ bilaterally.  No evidence of pedal edema. Pulmonary:     Comments: Clear to auscultation bilaterally.  Normal effort.  No respiratory distress.  No evidence of wheezes, rales, or rhonchi heard throughout. Abdominal:     General: Abdomen is flat. Bowel sounds are normal. There is no distension.     Tenderness: There is no abdominal tenderness. There is no guarding or rebound.  Musculoskeletal:        General: Normal range of motion.     Cervical back: Neck supple.  Skin:    General:  Skin is warm and dry.     Findings: No rash.  Neurological:     General: No focal deficit present.     Mental Status: She is alert.     Comments: Cranial nerves II through XII are intact.  5/5 strength to the upper and lower extremities.  Normal sensation to the upper and lower extremities.  No dysmetria with finger-nose.  No pronator drift.  Answers all questions appropriately.  No slurred speech or facial droop.  Psychiatric:        Mood and Affect: Mood normal.        Behavior: Behavior normal.     ED Results / Procedures / Treatments   Labs (all labs ordered are listed, but only abnormal results are displayed) Labs Reviewed  BASIC METABOLIC PANEL - Abnormal; Notable for the following components:      Result Value   BUN 25 (*)    All other components within normal limits  CBC WITH DIFFERENTIAL/PLATELET    EKG None  Radiology CT Head Wo Contrast  Result Date: 06/15/2022 CLINICAL DATA:  Headache, new onset EXAM: CT HEAD WITHOUT CONTRAST TECHNIQUE: Contiguous axial images were obtained from the base of the skull through the vertex without intravenous contrast. RADIATION DOSE REDUCTION: This exam was performed according to the departmental dose-optimization program which includes automated exposure control, adjustment of the mA and/or kV according to patient size and/or use of iterative reconstruction technique. COMPARISON:  03/25/2021 FINDINGS: Brain: No evidence of acute infarction, hemorrhage, hydrocephalus, extra-axial collection or mass lesion/mass effect. Encephalomalacia with vessel clips in the inferior and posterior left temporal lobe, there is chart history of AVM. Vascular: No hyperdense vessel or unexpected calcification. Skull: Unremarkable left temporal resection site. Sinuses/Orbits: Polypoid opacity in the left maxillary sinus that is stable and likely retention cyst. Clear mastoids. IMPRESSION: 1. No acute finding or change from 2022. 2. Stable postoperative left temporal  lobe. Electronically Signed   By: Tiburcio Pea M.D.   On: 06/15/2022 11:32    Procedures Procedures    Medications Ordered in ED Medications - No data to display  ED Course/ Medical Decision Making/ A&P Clinical Course as of 06/15/22 1250  Thu Jun 15, 2022  1220 CBC with Differential Normal.  [CF]  1220 Basic metabolic panel(!) Normal.  [CF]  1220 CT Head Wo Contrast I personally ordered and interpreted this study and do not see any  [CF]  1232 On reevaluation, patient's blood pressure has normalized here.  Her CT scan was normal.  Labs are also normal. [CF]    Clinical Course User Index [CF] Teressa Lower, PA-C   {   Click here for ABCD2, HEART and other calculators  Medical Decision Making  VIRA CHAPLIN is a 57 y.o. female patient who presents to the emergency department today for further evaluation of headache and elevated blood pressures.  Currently her blood pressure in the room is 151/111.  She does not meet criteria for hypertensive urgency or emergency.  However, given the patient's history of craniotomy secondary to AVM and with the headache I am going to scan her head and get some basic labs.  I will hold off on blood pressure medication until CT head results.  Patient is in no acute distress at this time.  Patient's blood pressure continues to improve in the emergency room.  Patient does not meet hypertensive urgency or emergency criteria.  No intervention is needed at this time.  Imaging and labs were all normal.  Strict return precautions were discussed.  She will follow-up with her primary care doctor and keep a blood pressure log.  She is safe for discharge.  Amount and/or Complexity of Data Reviewed Labs: ordered. Decision-making details documented in ED Course. Radiology: ordered. Decision-making details documented in ED Course.   Final Clinical Impression(s) / ED Diagnoses Final diagnoses:  Hypertension, unspecified type    Rx / DC Orders ED  Discharge Orders     None         Hendricks Limes, [place] 06/15/22 1250    Fredia Sorrow, MD 06/15/22 [id number]

## 2022-07-17 ENCOUNTER — Encounter: Payer: Self-pay | Admitting: Primary Care

## 2022-07-17 ENCOUNTER — Ambulatory Visit (INDEPENDENT_AMBULATORY_CARE_PROVIDER_SITE_OTHER): Payer: BC Managed Care – PPO | Admitting: Primary Care

## 2022-07-17 VITALS — BP 120/80 | HR 71 | Temp 97.8°F | Ht 63.0 in | Wt 293.0 lb

## 2022-07-17 DIAGNOSIS — R0683 Snoring: Secondary | ICD-10-CM

## 2022-07-17 DIAGNOSIS — Z8774 Personal history of (corrected) congenital malformations of heart and circulatory system: Secondary | ICD-10-CM | POA: Diagnosis not present

## 2022-07-17 DIAGNOSIS — G4734 Idiopathic sleep related nonobstructive alveolar hypoventilation: Secondary | ICD-10-CM | POA: Diagnosis not present

## 2022-07-17 NOTE — Assessment & Plan Note (Signed)
-   Refer to orthodontics d.t snoring/mild OSA consideration for oral appliance

## 2022-07-17 NOTE — Patient Instructions (Addendum)
We can't order oxygen of sleep study from 2021  We need to check overnight oximetry test to confirm oxygen need at night (this is not a sleep study) After having this we should be able to order oxygen to use at night We will also refer you to orthodontics for oral appliance d/t snoring  Work on weight loss efforts Focus on side sleeping position or elevate head or bed with wedge pillow   Orders: Overnight oximetry  Referral: Orthodontics re: snoring   Follow-up 3 months with Dr. Ander Slade or Young (sleep patient-30 mins)

## 2022-07-17 NOTE — Assessment & Plan Note (Signed)
-   Hx left temporal occipital AVM, she underwent embolization procedure with craniotomy for AVM resection in December 2021- followed by [facility] neurosurgery.

## 2022-07-17 NOTE — Assessment & Plan Note (Addendum)
-   Patient had PSG in August 2021 that showed borderline mild OSA with AHI 5.0/hr and severe oxygen desaturation with SpO2 low 65%. No underlying pulmonary condition, she has had normal spirometry testing. She has tried CPAP in the past but did not tolerate. Checking overnight oximetry test on room air to reassess nocturnal oxygen need. Encourage weight loss efforts.

## 2022-07-17 NOTE — Progress Notes (Signed)
$@PatientN$  ID: [name], female    DOB: 1966-02-11, 57 y.o.   MRN: [id number]  Chief Complaint  Patient presents with   Consult    Referring provider: Nicholes Rough, PA-C  HPI: 57 year old female, former smoker quit 1992.  Past medical history significant for seasonal allergic rhinitis, thyroid nodule.   07/17/2022 Patient presents today for sleep consult. Hx left temporal occipital AVM, she underwent embolization procedure with craniotomy for AVM resection in December 2021- followed by [facility] neurosurgery.   She does snore and she is always tired. She had PSG on 12/29/2019, AHI 5.0/hr with SpO2 low 65% (average 92%). Weight 242lbs. She tried CPAP and did not tolerate. She saw ENT, she is not candidate for inspire device. She has been told that she needs oxygen at night, would like to discuss today. She is open to trying an oral appliance as well for snoring/mild sleep apnea.    Sleep questionnaire Symptoms-  Snoring, fatigue, nocturnal hypoxia  Prior sleep study- August 2021 >> AHI 5.0/hr with SpO2 low 65%  Bedtime- 10-11pm Time to fall asleep- depends  Nocturnal awakenings- 3-4 times Out of bed/start of day- 7-8am Weight changes- NA Do you operate heavy machinery- No Do you currently wear CPAP- No Do you current wear oxygen- No [facility]- 11  No Known Allergies  Immunization History  Administered Date(s) Administered   PFIZER(Purple Top)SARS-COV-2 Vaccination 09/01/2019, 09/29/2019   PNEUMOCOCCAL CONJUGATE-20 02/24/2022    Past Medical History:  Diagnosis Date   ADD (attention deficit disorder)    AVM (arteriovenous malformation)    Hypertension     Tobacco History: Social History   Tobacco Use  Smoking Status Former   Years: 5.00   Types: Cigarettes   Quit date: 05/29/1990   Years since quitting: 32.1  Smokeless Tobacco Never   Counseling given: Not Answered   Outpatient Medications Prior to Visit  Medication Sig Dispense Refill   albuterol (VENTOLIN  HFA) 108 (90 Base) MCG/ACT inhaler Inhale 2 puffs into the lungs every 6 (six) hours as needed.     atorvastatin (LIPITOR) 40 MG tablet Take 40 mg by mouth daily.     escitalopram (LEXAPRO) 10 MG tablet Take 10 mg by mouth daily.     fluticasone (FLOVENT HFA) 220 MCG/ACT inhaler Inhale 2 puffs into the lungs 2 (two) times daily. (Patient taking differently: Inhale 2 puffs into the lungs 2 (two) times daily as needed. PRN) 1 Inhaler 5   Fluticasone Propionate (XHANCE) 93 MCG/ACT EXHU Place 2 puffs into the nose 2 (two) times daily. (Patient taking differently: Place 2 puffs into the nose 2 (two) times daily as needed. PRN) 32 mL 12   lisinopril (ZESTRIL) 10 MG tablet Take 10 mg by mouth daily.     progesterone (PROMETRIUM) 100 MG capsule Take 100 mg by mouth daily.     methylphenidate (CONCERTA) 54 MG CR tablet Take 27 mg by mouth every morning.     Carbinoxamine Maleate 4 MG TABS Take 1 tablet (4 mg total) by mouth every 8 (eight) hours as needed. (Patient not taking: Reported on 07/17/2022) 30 each 5   methylphenidate 27 MG PO CR tablet Take 27 mg by mouth every morning. (Patient not taking: Reported on 07/17/2022)     montelukast (SINGULAIR) 10 MG tablet Take 1 tablet (10 mg total) by mouth at bedtime. (Patient not taking: Reported on 07/17/2022) 30 tablet 5   topiramate (TOPAMAX) 50 MG tablet Take by mouth. (Patient not taking: Reported on 07/17/2022)  No facility-administered medications prior to visit.    Review of Systems  Review of Systems  Constitutional:  Positive for fatigue.  HENT: Negative.    Respiratory: Negative.    Cardiovascular: Negative.   Neurological: Negative.   Psychiatric/Behavioral: Negative.     Physical Exam  BP 120/80 (BP Location: Right Wrist, Patient Position: Sitting, Cuff Size: Large)   Pulse 71   Temp 97.8 F (36.6 C) (Oral)   Ht 5' 3"$  (1.6 m)   Wt 293 lb (132.9 kg)   SpO2 94%   BMI 51.90 kg/m  Physical Exam Constitutional:      Appearance:  Normal appearance.  HENT:     Head: Normocephalic and atraumatic.     Mouth/Throat:     Mouth: Mucous membranes are moist.     Pharynx: Oropharynx is clear.  Cardiovascular:     Rate and Rhythm: Normal rate and regular rhythm.  Pulmonary:     Effort: Pulmonary effort is normal.     Breath sounds: Normal breath sounds.  Skin:    General: Skin is warm and dry.  Neurological:     General: No focal deficit present.     Mental Status: She is alert and oriented to person, place, and time. Mental status is at baseline.  Psychiatric:        Mood and Affect: Mood normal.        Behavior: Behavior normal.        Thought Content: Thought content normal.        Judgment: Judgment normal.      Lab Results:  CBC    Component Value Date/Time   WBC 9.2 06/15/2022 1121   RBC 4.96 06/15/2022 1121   HGB 14.1 06/15/2022 1121   HGB 14.0 10/04/2017 1410   HCT 42.9 06/15/2022 1121   HCT 40.9 10/04/2017 1410   PLT 285 06/15/2022 1121   PLT 292 10/04/2017 1410   MCV 86.5 06/15/2022 1121   MCV 84 10/04/2017 1410   MCH 28.4 06/15/2022 1121   MCHC 32.9 06/15/2022 1121   RDW 14.1 06/15/2022 1121   RDW 14.6 10/04/2017 1410   LYMPHSABS 1.6 06/15/2022 1121   LYMPHSABS 2.3 10/04/2017 1410   MONOABS 0.8 06/15/2022 1121   EOSABS 0.1 06/15/2022 1121   EOSABS 0.2 10/04/2017 1410   BASOSABS 0.1 06/15/2022 1121   BASOSABS 0.0 10/04/2017 1410    BMET    Component Value Date/Time   NA 140 06/15/2022 1121   K 4.3 06/15/2022 1121   CL 108 06/15/2022 1121   CO2 22 06/15/2022 1121   GLUCOSE 94 06/15/2022 1121   BUN 25 (H) 06/15/2022 1121   CREATININE 0.88 06/15/2022 1121   CALCIUM 9.4 06/15/2022 1121   GFRNONAA >60 06/15/2022 1121   GFRAA >90 01/20/2014 1652    BNP No results found for: "BNP"  ProBNP No results found for: "PROBNP"  Imaging: No results found.   Assessment & Plan:   Nocturnal hypoxia - Patient had PSG in August 2021 that showed borderline mild OSA with AHI 5.0/hr and  severe oxygen desaturation with SpO2 low 65%. No underlying pulmonary condition, she has had normal spirometry testing. She has tried CPAP in the past but did not tolerate. Checking overnight oximetry test on room air to reassess nocturnal oxygen need. Encourage weight loss efforts.   Snoring - Refer to orthodontics d.t snoring/mild OSA consideration for oral appliance   Hx of arteriovenous malformation (AVM) - Hx left temporal occipital AVM, she underwent embolization procedure with  craniotomy for AVM resection in December 2021- followed by [facility] neurosurgery.    Martyn Ehrich, NP 07/17/2022

## 2022-07-17 NOTE — Progress Notes (Signed)
Had flu vaccine for 2023 at PCP office.

## 2022-07-18 NOTE — Progress Notes (Signed)
Reviewed and agree with assessment/plan.   Chesley Mires, MD [facility] Pulmonary/Critical Care 07/18/2022, 8:09 AM Pager:  [phone]

## 2022-08-01 ENCOUNTER — Telehealth: Payer: Self-pay | Admitting: Internal Medicine

## 2022-08-01 NOTE — Telephone Encounter (Signed)
Pt scheduled for tomorrow they had to wait to get cmn back before they could schedule

## 2022-08-01 NOTE — Telephone Encounter (Signed)
ONO was supposed to be set up. PT has not recd call yet. Pls call to advise. [phone]

## 2022-08-01 NOTE — Telephone Encounter (Signed)
Will close encounter since patient has been scheduled for [facility]

## 2022-08-02 IMAGING — CT CT HEAD W/O CM
3 series · 16 of 47 positions shown, 19 images · non-contrast
Comparison: 05/06/2020

CLINICAL DATA: Left-sided head and facial numbness for 1 day,
history of prior aneurysm

EXAM:
CT HEAD WITHOUT CONTRAST
TECHNIQUE: Contiguous axial images were obtained from the base of the skull
through the vertex without intravenous contrast.

[Series 2: head w o · axial · 0.41mm/px · z∈[+23,+158]mm · 10 of 33 slices shown, 13 images]
[im 3/33  brain]
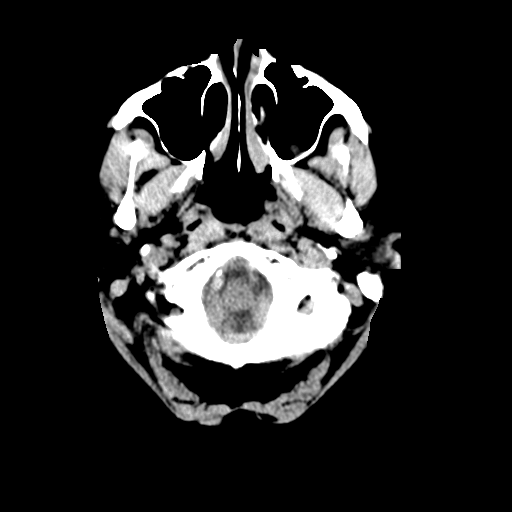
[im 3/33  bone]
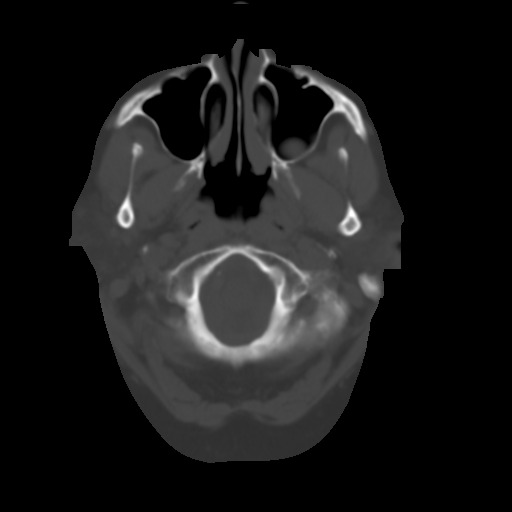
[im 6/33  brain]
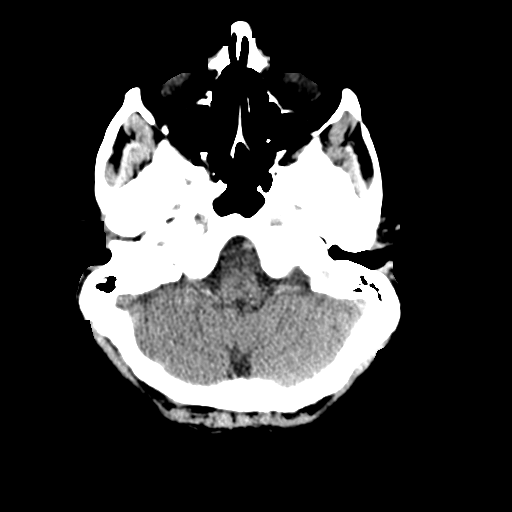
[im 9/33  brain]
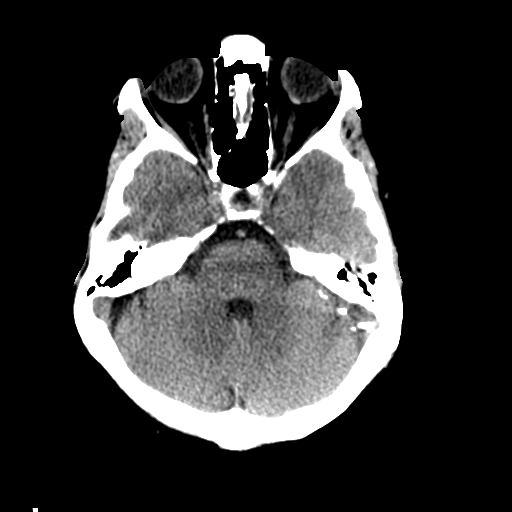
[im 12/33  brain]
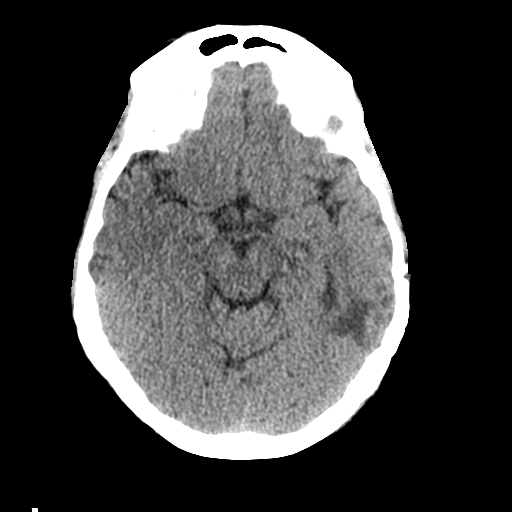
[im 15/33  brain]
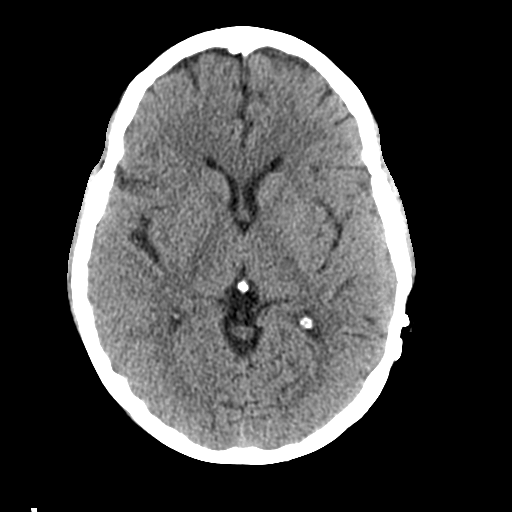
[im 15/33  bone]
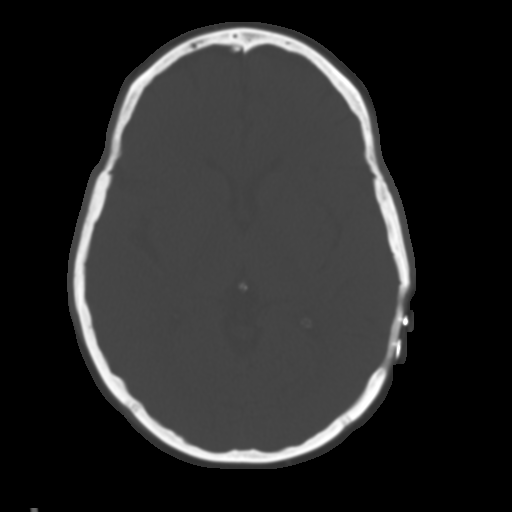
[im 18/33  brain]
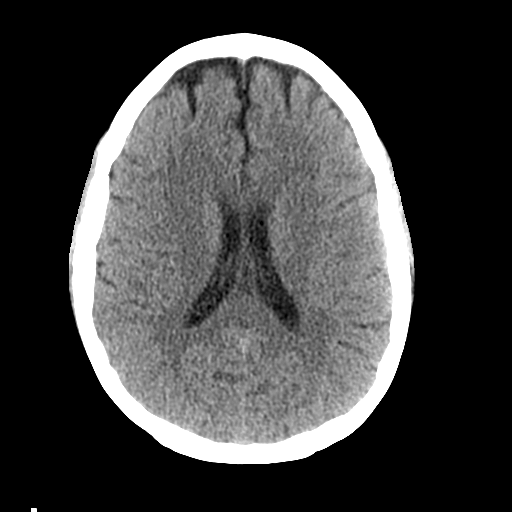
[im 21/33  brain]
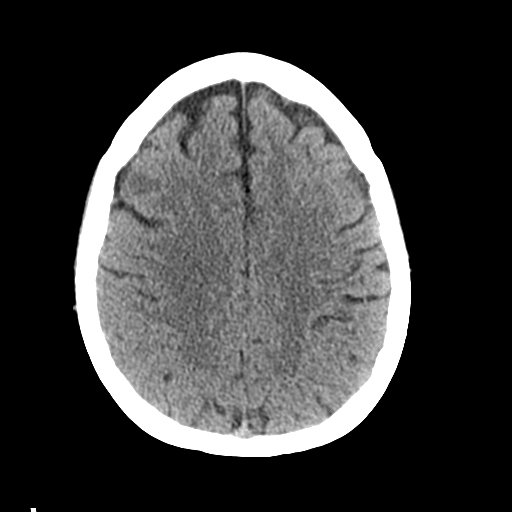
[im 25/33  brain]
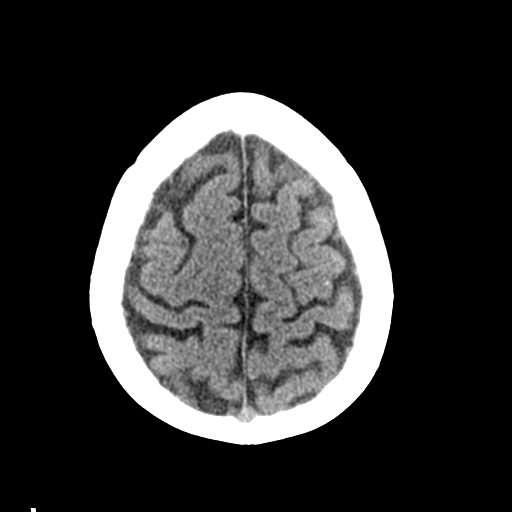
[im 27/33  brain]
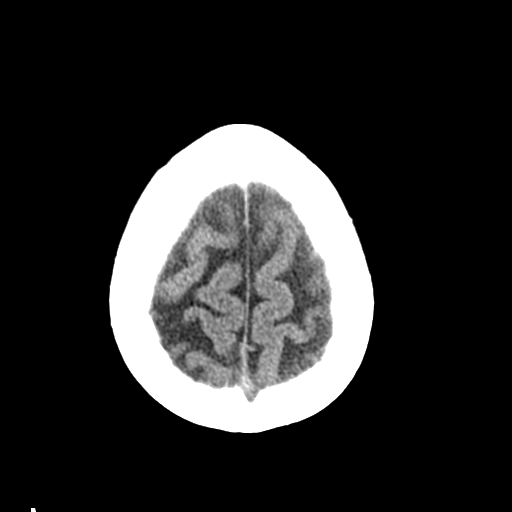
[im 27/33  bone]
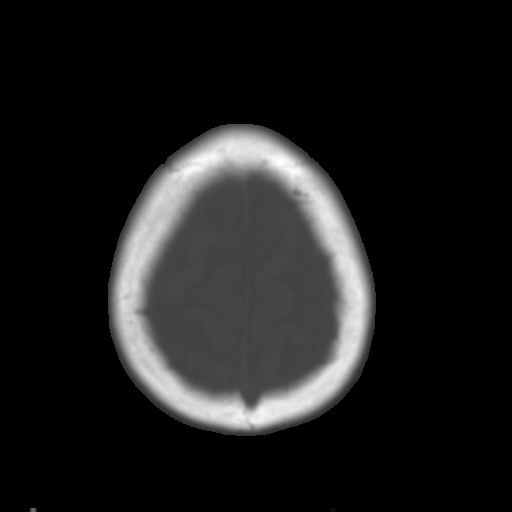
[im 30/33  brain]
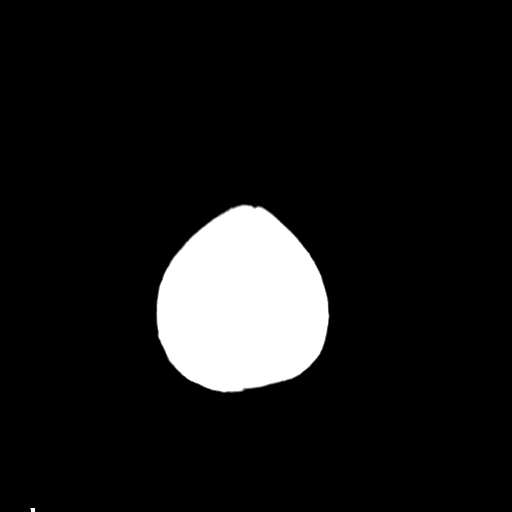

[Series 4: coronal soft · coronal · 0.34mm/px · 3 of 70 slices shown]
[im 24/70  brain]
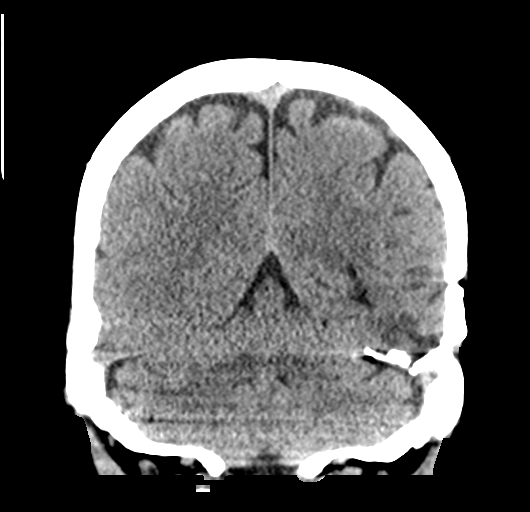
[im 31/70  brain]
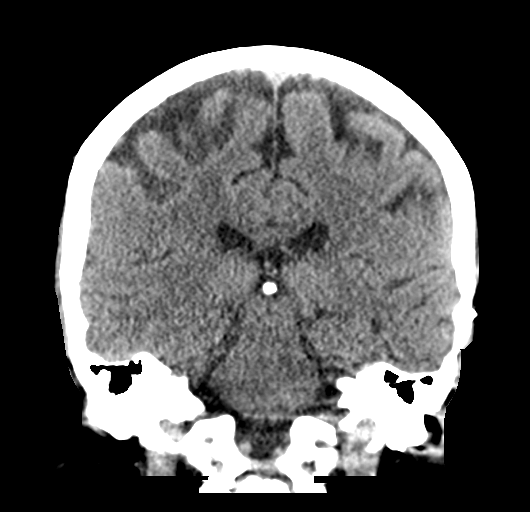
[im 39/70  brain]
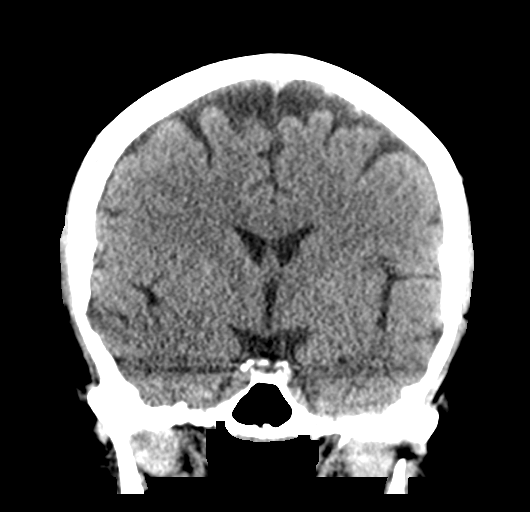

[Series 5: sagittal soft · sagittal · 0.35mm/px · 3 of 58 slices shown]
[im 20/58  brain]
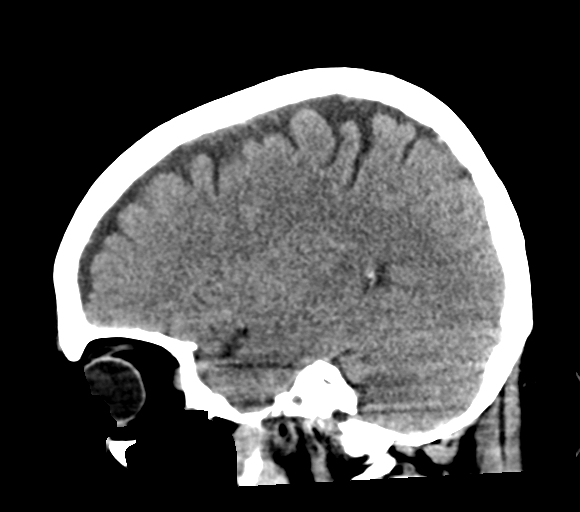
[im 29/58  brain]
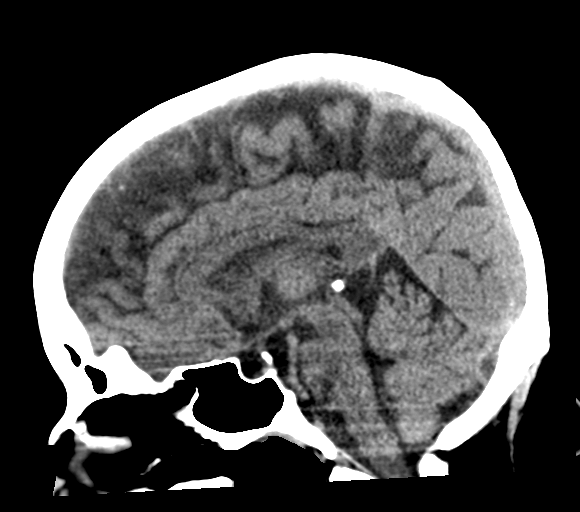
[im 39/58  brain]
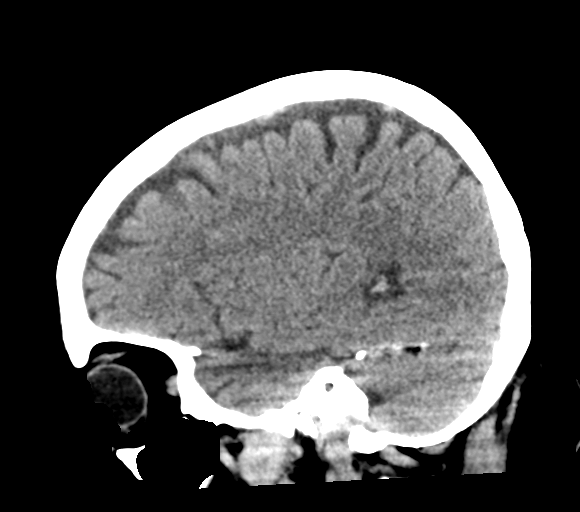

[16 of 47 positions shown; findings below may reference images not displayed]

FINDINGS: Brain: Left temporal encephalomalacia identified consistent with
prior intracranial hemorrhage. Underlying aneurysm clip is noted.

No signs of acute infarct or hemorrhage. Lateral ventricles and
midline structures are unremarkable. No acute extra-axial fluid
collections. No mass effect.

Vascular: No hyperdense vessel or unexpected calcification.

Skull: Postsurgical changes from left temporal craniotomy. The
remainder of the calvarium is unremarkable.

Sinuses/Orbits: Polypoid mucosal thickening left maxillary sinus
unchanged. Remaining paranasal sinuses are clear.

Other: None.
IMPRESSION: 1. Postsurgical changes from left temporal craniotomy and aneurysm
clipping. Minimal encephalomalacia.
2. No acute intracranial process.

## 2022-08-09 ENCOUNTER — Telehealth: Payer: Self-pay | Admitting: Internal Medicine

## 2022-08-09 NOTE — Telephone Encounter (Signed)
Pt calling to find out the results from overnight ox.test

## 2022-08-10 NOTE — Telephone Encounter (Signed)
ONO results were laid at [facility] computer for her to review when she gets back into office.

## 2022-08-11 NOTE — Telephone Encounter (Signed)
ON oh from 08/02/2022 showed patient spent 1 hour 24 minutes with oxygen level less than 88%.  SpO2 low 67%, average 90%.  Needs to continue to wear 3 L of oxygen at night.   Can you ask if she has established with orthodontics for oral appliance. If she has not had breathing test please order pulmonary function testing to assess lung function as well.

## 2022-08-11 NOTE — Telephone Encounter (Signed)
ATC LVMTCB x 1  

## 2022-08-14 ENCOUNTER — Telehealth: Payer: Self-pay | Admitting: Primary Care

## 2022-08-14 NOTE — Telephone Encounter (Signed)
PT ret our call about test results with ONO. Pls call to explain and tell her what a PFT is and why she needs it. I read notes to here and she is not 100% understanding.  All she really wanted was to have O2 Rx'd for nite time sleeping, not dental appliance,  so she is not sure why she needs to set this next test up.   Pls call @ [phone]

## 2022-08-15 ENCOUNTER — Telehealth: Payer: Self-pay | Admitting: Acute Care

## 2022-08-15 NOTE — Telephone Encounter (Signed)
Called and left voicemail for patient to call office back to go over her questions.

## 2022-08-15 NOTE — Telephone Encounter (Signed)
Hey beth,  Can you confirm or deny if you have the current ONO results for this patient.   If not I will call Mariann Laster and get them refaxed to you   Thank you

## 2022-08-15 NOTE — Telephone Encounter (Signed)
[facility] calling to be sure [name] got the ONO results. PT does qual for O2. Test only good for 30 days. Pls call Mariann Laster @ [place] [phone] to advise.

## 2022-08-16 ENCOUNTER — Other Ambulatory Visit: Payer: Self-pay

## 2022-08-16 DIAGNOSIS — G4734 Idiopathic sleep related nonobstructive alveolar hypoventilation: Secondary | ICD-10-CM

## 2022-08-16 NOTE — Telephone Encounter (Signed)
Went over [employer] with patient. Order has been placed for patient to start using o2 at night. Patient advises she will call back to schedule PFT as she is currently at work.

## 2022-08-16 NOTE — Telephone Encounter (Signed)
Ono on 08/02/2022 showed patient spent 1 hour 24 minutes with SpO2 low less 88%.  SpO2 low 67% with average 90%.  She needs to be started on 2.5-3 L oxygen if not already on supplemental O2 for nocturnal hypoxia.

## 2022-08-24 NOTE — Telephone Encounter (Signed)
Re-routing to triage for one more call back to explain ONO

## 2022-08-30 NOTE — Telephone Encounter (Signed)
Refer to encounter from 3.19

## 2022-09-14 NOTE — Telephone Encounter (Addendum)
Please refer to encounter from 3/19.

## 2022-10-18 ENCOUNTER — Other Ambulatory Visit: Payer: Self-pay

## 2022-10-18 DIAGNOSIS — G4734 Idiopathic sleep related nonobstructive alveolar hypoventilation: Secondary | ICD-10-CM

## 2022-10-18 NOTE — Progress Notes (Signed)
@Patient  ID: [name], female    DOB: 1966/02/28, 57 y.o.   MRN: [id number]  No chief complaint on file.   Referring provider: Ladora Daniel, PA-C  HPI: 57 year old female, former smoker quit 1992.  Past medical history significant for seasonal allergic rhinitis, thyroid nodule, Craniotomy/resection AVM 2021, ADD, HTN,   NPSG 12/29/19-AHI 5.0/hr with SpO2 low 65% (average 92%). Weight 242lbs  Failed to tolerate CPAP.  Referred to Orthodontics for OAP. ONOX 08/02/22- 1 minute 34 seconds </= 88% qualifies Medicare group 1. PFT- 10/18/22- severe obstruction with response to BD, DLCO wnl ========================================================= 2/19/24Clent Ridges, NP Patient presents today for sleep consult. Hx left temporal occipital AVM, she underwent embolization procedure with craniotomy for AVM resection in December 2021- followed by [facility] neurosurgery.   She does snore and she is always tired. She had PSG on 12/29/2019, AHI 5.0/hr with SpO2 low 65% (average 92%). Weight 242lbs. She tried CPAP and did not tolerate. She saw ENT, she is not candidate for inspire device. She has been told that she needs oxygen at night, would like to discuss today. She is open to trying an oral appliance as well for snoring/mild sleep apnea.   10/19/22- 58 year old female, former smoker quit 1992, followed for COPD,  seasonal allergic rhinitis, thyroid nodule, Craniotomy/resection AVM 2021, ADD, HTN,   NPSG 12/29/19-AHI 5.0/hr with SpO2 low 65% (average 92%). Weight 242lbs  Failed to tolerate CPAP.  Referred to Orthodontics for OAP. ONOX 08/02/22- 1 minute 34 seconds </= 88% qualifies Medicare group 1. O2 2 L Sleep  [name] -albuterol hfa, Flovent 220, Trelegy 100, Singulair, Xhance,  methylphenidate 27CR,  Body weight today-298 lbs -----Having issues with O2 machine states it makes loud noise. States [facility] will be bringing new 02 tomorrow  To select trial of CPAP for her minimal OSA.   She is not a candidate for Inspire. Chest" sleep spring and fall.  Has albuterol and nebulizer PFT- 10/18/22- severe obstruction with response to BD, DLCO wnl CXR 01/12/22- IMPRESSION: No active cardiopulmonary disease.  ROS-see HPI  + = positive Constitutional:    weight loss, night sweats, fevers, chills, fatigue, lassitude. HEENT:    headaches, difficulty swallowing, tooth/dental problems, sore throat,       sneezing, itching, ear ache, nasal congestion, post nasal drip, snoring CV:    chest pain, orthopnea, PND, swelling in lower extremities, anasarca,                                  dizziness, palpitations Resp:   +shortness of breath with exertion or at rest.                productive cough,   non-productive cough, coughing up of blood.              change in color of mucus.  wheezing.   Skin:    rash or lesions. GI:  No-   heartburn, indigestion, abdominal pain, nausea, vomiting, diarrhea,                 change in bowel habits, loss of appetite GU: dysuria, change in color of urine, no urgency or frequency.   flank pain. MS:   joint pain, stiffness, decreased range of motion, back pain. Neuro-     nothing unusual Psych:  change in mood or affect.  depression or anxiety.   memory loss.  OBJ- Physical Exam General- Alert, Oriented,  Affect-appropriate, Distress- none acute, + obese Skin- rash-none, lesions- none, excoriation- none Lymphadenopathy- none Head- atraumatic            Eyes- Gross vision intact, PERRLA, conjunctivae and secretions clear            Ears- Hearing, canals-normal            Nose- Clear, no-Septal dev, mucus, polyps, erosion, perforation             Throat- Mallampati II , mucosa clear , drainage- none, tonsils- atrophic Neck- flexible , trachea midline, no stridor , thyroid nl, carotid no bruit Chest - symmetrical excursion , unlabored           Heart/CV- RRR , no murmur , no gallop  , no rub, nl s1 s2                           - JVD- none , edema-  none, stasis changes- none, varices- none           Lung- clear to P&A, wheeze- none, cough- none , dullness-none, rub- none           Chest wall-  Abd-  Br/ Gen/ Rectal- Not done, not indicated Extrem- cyanosis- none, clubbing, none, atrophy- none, strength- nl Neuro- grossly intact to observation

## 2022-10-18 NOTE — Progress Notes (Signed)
pft  

## 2022-10-19 ENCOUNTER — Ambulatory Visit (INDEPENDENT_AMBULATORY_CARE_PROVIDER_SITE_OTHER): Payer: BC Managed Care – PPO | Admitting: Internal Medicine

## 2022-10-19 ENCOUNTER — Encounter: Payer: Self-pay | Admitting: Internal Medicine

## 2022-10-19 VITALS — BP 124/78 | HR 83 | Ht 63.0 in | Wt 298.4 lb

## 2022-10-19 DIAGNOSIS — G4734 Idiopathic sleep related nonobstructive alveolar hypoventilation: Secondary | ICD-10-CM

## 2022-10-19 DIAGNOSIS — J449 Chronic obstructive pulmonary disease, unspecified: Secondary | ICD-10-CM

## 2022-10-19 LAB — PULMONARY FUNCTION TEST
DL/VA % pred: 106 %
DL/VA: 4.57 ml/min/mmHg/L
DLCO cor % pred: 98 %
DLCO cor: 19.58 ml/min/mmHg
DLCO unc % pred: 98 %
DLCO unc: 19.58 ml/min/mmHg
FEF 25-75 Post: 1.75 L/sec
FEF 25-75 Pre: 0.42 L/sec
FEF2575-%Change-Post: 316 %
FEF2575-%Pred-Post: 70 %
FEF2575-%Pred-Pre: 17 %
FEV1-%Change-Post: 76 %
FEV1-%Pred-Post: 75 %
FEV1-%Pred-Pre: 43 %
FEV1-Post: 1.95 L
FEV1-Pre: 1.11 L
FEV1FVC-%Change-Post: 57 %
FEV1FVC-%Pred-Pre: 54 %
FEV6-%Change-Post: 12 %
FEV6-%Pred-Post: 90 %
FEV6-%Pred-Pre: 80 %
FEV6-Post: 2.89 L
FEV6-Pre: 2.57 L
FEV6FVC-%Change-Post: 0 %
FEV6FVC-%Pred-Post: 103 %
FEV6FVC-%Pred-Pre: 103 %
FVC-%Change-Post: 12 %
FVC-%Pred-Post: 87 %
FVC-%Pred-Pre: 78 %
FVC-Post: 2.89 L
FVC-Pre: 2.57 L
Post FEV1/FVC ratio: 67 %
Post FEV6/FVC ratio: 100 %
Pre FEV1/FVC ratio: 43 %
Pre FEV6/FVC Ratio: 100 %
RV % pred: 125 %
RV: 2.32 L
TLC % pred: 106 %
TLC: 5.25 L

## 2022-10-19 MED ORDER — TRELEGY ELLIPTA 100-62.5-25 MCG/ACT IN AEPB: 1.0000 | INHALATION_SPRAY | Freq: Every day | RESPIRATORY_TRACT | 0 refills | Status: DC

## 2022-10-19 NOTE — Patient Instructions (Addendum)
Ok to be off O2 at night for short trips as discussed  Suggest you ask [employer] for long enough hose that you can park the concentrator outside your bedroom to keep noise down. Ok to run it at [employer].  Based on your pulmonary function test you have COPD.  Order- sample x 2 Trelegy 100 maintenance inhaler. Inhale 1 puff then rinse mouth, once daily.  Please call if we can help

## 2022-10-19 NOTE — Progress Notes (Signed)
Full PFT performed today. °

## 2022-10-19 NOTE — Patient Instructions (Signed)
Full PFT performed today. °

## 2022-10-30 ENCOUNTER — Other Ambulatory Visit (HOSPITAL_COMMUNITY)
Admission: RE | Admit: 2022-10-30 | Discharge: 2022-10-30 | Disposition: A | Discharge status: Home / Self Care | Payer: BC Managed Care – PPO | Source: Ambulatory Visit | Attending: Internal Medicine | Admitting: Internal Medicine

## 2022-10-30 DIAGNOSIS — J449 Chronic obstructive pulmonary disease, unspecified: Secondary | ICD-10-CM | POA: Insufficient documentation

## 2022-11-02 LAB — ALPHA-1-ANTITRYPSIN PHENOTYP: A-1 Antitrypsin, Ser: 162 mg/dL (ref 101–187)

## 2022-11-03 ENCOUNTER — Telehealth: Payer: Self-pay | Admitting: Internal Medicine

## 2022-11-03 NOTE — Telephone Encounter (Signed)
Mackenzie Budge, MD 11/03/2022  9:10 AM EDT     Lab- alpha-1-antitrypsin assay was normal. No genetic increased risk of emphysema.     Patient is aware of results and voiced her understanding.  Nothing further needed.

## 2022-11-20 ENCOUNTER — Encounter: Payer: Self-pay | Admitting: Internal Medicine

## 2022-11-20 MED ORDER — TRELEGY ELLIPTA 100-62.5-25 MCG/ACT IN AEPB: 1.0000 | INHALATION_SPRAY | Freq: Every day | RESPIRATORY_TRACT | 12 refills | Status: DC

## 2022-11-20 NOTE — Telephone Encounter (Signed)
If anything, Trelegy would be expected to be a mild stimulant. The obvious way to know for sure would be to use something else for a while and be off Trelegy, then restart it. Ok to refill her Trelegy for 12 months as requested.

## 2022-11-20 NOTE — Telephone Encounter (Signed)
I have received a mychart message from patient.   Pt would like to know if a common side effect of Trelegy is sleepiness. Please advise. Thanks !

## 2022-12-07 ENCOUNTER — Encounter: Payer: Self-pay | Admitting: Internal Medicine

## 2022-12-07 DIAGNOSIS — J449 Chronic obstructive pulmonary disease, unspecified: Secondary | ICD-10-CM | POA: Insufficient documentation

## 2022-12-07 NOTE — Assessment & Plan Note (Signed)
Plan- samples Trelegy 100

## 2022-12-07 NOTE — Assessment & Plan Note (Signed)
Ok short times off O2 but should use it for sleep Plan- lab for a1AT assay

## 2022-12-27 ENCOUNTER — Ambulatory Visit
Admission: RE | Admit: 2022-12-27 | Discharge: 2022-12-27 | Disposition: A | Discharge status: Home / Self Care | Payer: BC Managed Care – PPO | Source: Ambulatory Visit | Attending: Family Medicine | Admitting: Family Medicine

## 2022-12-27 VITALS — BP 126/82 | HR 66 | Temp 97.6°F | Resp 18

## 2022-12-27 DIAGNOSIS — H6593 Unspecified nonsuppurative otitis media, bilateral: Secondary | ICD-10-CM

## 2022-12-27 MED ORDER — PREDNISONE 50 MG PO TABS: ORAL_TABLET | ORAL | 0 refills | Status: DC

## 2022-12-27 MED ORDER — FLUTICASONE PROPIONATE 50 MCG/ACT NA SUSP: 1.0000 | Freq: Two times a day (BID) | NASAL | 2 refills | Status: DC

## 2022-12-27 NOTE — ED Provider Notes (Signed)
[facility]    CSN: [id number] Arrival date & time: 12/27/22  1450      History   Chief Complaint Chief Complaint  Patient presents with   Ear Fullness    Entered by patient    HPI [name] is a 57 y.o. female.   Patient presenting today with several day history of right ear pain, pressure.  Feels this to a lesser extent in the left side as well.  Denies drainage, fevers, injury, congestion, cough, headache.  Only thing new recently is she was recently swimming at great [employer].  Not tried anything for symptoms thus far.    Past Medical History:  Diagnosis Date   ADD (attention deficit disorder)    AVM (arteriovenous malformation)    Hypertension     Patient Active Problem List   Diagnosis Date Noted   COPD mixed type (HCC) 12/07/2022   Nocturnal hypoxia 07/17/2022   Snoring 07/17/2022   Hx of arteriovenous malformation (AVM) 07/17/2022   Cough, persistent 10/03/2017   Seasonal and perennial allergic rhinitis 10/03/2017   Wheezing/coughing 10/03/2017   Recurrent respiratory infections 10/03/2017   Multiple thyroid nodules 03/15/2015   Abdominal pain, chronic, right lower quadrant 11/20/2011   Rectal bleeding 11/20/2011    Past Surgical History:  Procedure Laterality Date   BRAIN SURGERY  2021   CESAREAN SECTION  05/30/1987   HERNIA REPAIR  05/30/2011   Lap Ventral Hernia Repair   PARTIAL HYSTERECTOMY  05/29/2010   TUBAL LIGATION  05/29/1990   VENTRAL HERNIA REPAIR  01/09/2012   Procedure: LAPAROSCOPIC VENTRAL HERNIA;  Surgeon: Mariella Saa, MD;  Location: [facility];  Service: General;  Laterality: N/A;  Laparoscopic Ventral Hernia Repair   WISDOM TOOTH EXTRACTION  05/29/2001    OB History   No obstetric history on file.      Home Medications    Prior to Admission medications   Medication Sig Start Date End Date Taking? Authorizing Provider  fluticasone (FLONASE) 50 MCG/ACT nasal spray Place 1 spray into both nostrils 2  (two) times daily. 12/27/22  Yes Particia Nearing, PA-C  predniSONE (DELTASONE) 50 MG tablet Take 1 tab daily with breakfast for 3 days 12/27/22  Yes Particia Nearing, PA-C  albuterol (VENTOLIN HFA) 108 (90 Base) MCG/ACT inhaler Inhale 2 puffs into the lungs every 6 (six) hours as needed. Patient not taking: Reported on 10/19/2022 01/09/22   [provider]  atorvastatin (LIPITOR) 40 MG tablet Take 40 mg by mouth daily.    [provider]  escitalopram (LEXAPRO) 10 MG tablet Take 10 mg by mouth daily.    [provider]  fluticasone (FLOVENT HFA) 220 MCG/ACT inhaler Inhale 2 puffs into the lungs 2 (two) times daily. Patient not taking: Reported on 10/19/2022 10/03/17   Bobbitt, Heywood Iles, MD  Fluticasone Propionate Timmothy Sours) 93 MCG/ACT EXHU Place 2 puffs into the nose 2 (two) times daily. Patient not taking: Reported on 10/19/2022 10/03/17   Bobbitt, Heywood Iles, MD  Fluticasone-Umeclidin-Vilant (TRELEGY ELLIPTA) 100-62.5-25 MCG/ACT AEPB Inhale 1 puff into the lungs daily. 11/20/22   Jetty Duhamel D, MD  lisinopril (ZESTRIL) 10 MG tablet Take 10 mg by mouth daily.    [provider]  methylphenidate 27 MG PO CR tablet Take 27 mg by mouth every morning. 05/12/22   [provider]  montelukast (SINGULAIR) 10 MG tablet Take 1 tablet (10 mg total) by mouth at bedtime. Patient not taking: Reported on 07/17/2022 10/03/17   Bobbitt,  Heywood Iles, MD  progesterone (PROMETRIUM) 100 MG capsule Take 100 mg by mouth daily. 05/24/22   [provider]  loratadine (CLARITIN) 10 MG tablet Take 10 mg by mouth daily. PRN  10/28/20  [provider]    Family History Family History  Problem Relation Age of Onset   Asthma Neg Hx    Allergic rhinitis Neg Hx    Angioedema Neg Hx    Eczema Neg Hx    Immunodeficiency Neg Hx    Urticaria Neg Hx     Social History Social History   Tobacco Use   Smoking status: Former    Current packs/day: 0.00     Types: Cigarettes    Start date: 05/29/1985    Quit date: 05/29/1990    Years since quitting: 32.6   Smokeless tobacco: Never  Vaping Use   Vaping status: Never Used  Substance Use Topics   Alcohol use: No    Comment: rare occasion   Drug use: No     Allergies   Patient has no known allergies.   Review of Systems Review of Systems Per HPI  Physical Exam Triage Vital Signs ED Triage Vitals [12/27/22 1455]  Encounter Vitals Group     BP 126/82     Systolic BP Percentile      Diastolic BP Percentile      Pulse Rate 66     Resp 18     Temp 97.6 F (36.4 C)     Temp Source Oral     SpO2 93 %     Weight      Height      Head Circumference      Peak Flow      Pain Score 6     Pain Loc      Pain Education      Exclude from Growth Chart    No data found.  Updated Vital Signs BP 126/82 (BP Location: Right Arm)   Pulse 66   Temp 97.6 F (36.4 C) (Oral)   Resp 18   SpO2 93%   Visual Acuity Right Eye Distance:   Left Eye Distance:   Bilateral Distance:    Right Eye Near:   Left Eye Near:    Bilateral Near:     Physical Exam Vitals and nursing note reviewed.  Constitutional:      Appearance: Normal appearance. She is not ill-appearing.  HENT:     Head: Atraumatic.     Right Ear: Ear canal and external ear normal.     Left Ear: Ear canal and external ear normal.     Ears:     Comments: Bilateral middle ear effusion    Nose: Nose normal.     Mouth/Throat:     Mouth: Mucous membranes are moist.     Pharynx: Oropharynx is clear.  Eyes:     Extraocular Movements: Extraocular movements intact.     Conjunctiva/sclera: Conjunctivae normal.  Cardiovascular:     Rate and Rhythm: Normal rate and regular rhythm.     Heart sounds: Normal heart sounds.  Pulmonary:     Effort: Pulmonary effort is normal.     Breath sounds: Normal breath sounds.  Musculoskeletal:        General: Normal range of motion.     Cervical back: Normal range of motion and neck supple.   Skin:    General: Skin is warm and dry.  Neurological:     Mental Status: She is alert and  oriented to person, place, and time.  Psychiatric:        Mood and Affect: Mood normal.        Thought Content: Thought content normal.        Judgment: Judgment normal.      UC Treatments / Results  Labs (all labs ordered are listed, but only abnormal results are displayed) Labs Reviewed - No data to display  EKG   Radiology No results found.  Procedures Procedures (including critical care time)  Medications Ordered in UC Medications - No data to display  Initial Impression / Assessment and Plan / UC Course  I have reviewed the triage vital signs and the nursing notes.  Pertinent labs & imaging results that were available during my care of the patient were reviewed by me and considered in my medical decision making (see chart for details).     Treat with short course of prednisone, Flonase, Coricidin HBP as needed.  Return for worsening symptoms.  Final Clinical Impressions(s) / UC Diagnoses   Final diagnoses:  Middle ear effusion, bilateral     Discharge Instructions      In addition to the prescribed medications, may take a decongestant such as Coricidin HBP as needed for pain and pressure    ED Prescriptions     Medication Sig Dispense Auth. Provider   predniSONE (DELTASONE) 50 MG tablet Take 1 tab daily with breakfast for 3 days 3 tablet Particia Nearing, PA-C   fluticasone [facility]) 50 MCG/ACT nasal spray Place 1 spray into both nostrils 2 (two) times daily. 16 g Particia Nearing, [place]      PDMP not reviewed this encounter.   Particia Nearing, [place] 12/27/22 1521

## 2022-12-27 NOTE — Discharge Instructions (Signed)
In addition to the prescribed medications, may take a decongestant such as Coricidin HBP as needed for pain and pressure

## 2022-12-27 NOTE — ED Triage Notes (Signed)
Pt reports her right ear is painful after [place] days. Left ear is clogged

## 2023-02-20 ENCOUNTER — Encounter: Payer: Self-pay | Admitting: Internal Medicine

## 2023-02-20 ENCOUNTER — Ambulatory Visit: Payer: BC Managed Care – PPO | Admitting: Internal Medicine

## 2023-02-20 VITALS — BP 122/68 | HR 67 | Temp 97.2°F | Ht 63.0 in | Wt 301.2 lb

## 2023-02-20 DIAGNOSIS — J449 Chronic obstructive pulmonary disease, unspecified: Secondary | ICD-10-CM | POA: Diagnosis not present

## 2023-02-20 DIAGNOSIS — G4734 Idiopathic sleep related nonobstructive alveolar hypoventilation: Secondary | ICD-10-CM

## 2023-02-20 DIAGNOSIS — J3089 Other allergic rhinitis: Secondary | ICD-10-CM

## 2023-02-20 DIAGNOSIS — R062 Wheezing: Secondary | ICD-10-CM

## 2023-02-20 MED ORDER — BENZONATATE 200 MG PO CAPS: 200.0000 mg | ORAL_CAPSULE | Freq: Three times a day (TID) | ORAL | 1 refills | Status: DC | PRN

## 2023-02-20 NOTE — Assessment & Plan Note (Signed)
Primary issue is obesity hypoventilation. Plan-encouraged to continue oxygen during sleep.

## 2023-02-20 NOTE — Patient Instructions (Addendum)
Script sent to try benzonatate perles for cough  Order- refer to [facility] (Dr Kathyrn Lass group)   dx seasonal allergy  Order- sample x 2 Spiriva 2.5    inhale 2 puffs daily.  Try this instead of Trelegy. See if it avoids the sweating and still helps your breathing.

## 2023-02-20 NOTE — Assessment & Plan Note (Signed)
PFT needs to be rechecked when stable.  She thinks Trelegy was triggering diaphoresis. Plan-try Spiriva as an alternative to Trelegy.  Benzonatate Perles

## 2023-02-20 NOTE — Assessment & Plan Note (Signed)
Distinctly seasonal pattern associated with the fall.  I am impressed by the amount of sneezing she is doing.  Rather than repeat the kinds of treatments she is says she has had over and over with little benefit, I have suggested we refer for allergy evaluation.  It might be that most of this is viral triggered, but she does not seem to present as systemically ill.

## 2023-02-25 ENCOUNTER — Encounter: Payer: Self-pay | Admitting: Internal Medicine

## 2023-02-25 NOTE — Assessment & Plan Note (Signed)
Recurrent and seasonal. She describes multiple failed efforts with inhalers, antibiotics, prednisone over several years. Plan- Sample Spiriva, benzonatate. See what allergist thinks. Consider Biologic.

## 2023-03-07 ENCOUNTER — Encounter: Payer: Self-pay | Admitting: Internal Medicine

## 2023-03-09 ENCOUNTER — Telehealth: Payer: Self-pay | Admitting: Internal Medicine

## 2023-03-09 MED ORDER — TRAMADOL HCL 50 MG PO TABS: 50.0000 mg | ORAL_TABLET | Freq: Four times a day (QID) | ORAL | 0 refills | Status: DC | PRN

## 2023-03-09 NOTE — Telephone Encounter (Signed)
Checked pt's upcoming appts and see that pt has an appt scheduled with Dr. Wyline Mood with allergy and asthma on 10/22.

## 2023-03-09 NOTE — Telephone Encounter (Signed)
Call from [facility]. I discussed pt response to Spiriva with complaint of cough. I sent script for tramadol for cough and explained that. I will have our staff contact patient for earlier return to our office.   Order- [name]- please help patient with early ov return to me- held spot ok. -CDY

## 2023-03-09 NOTE — Telephone Encounter (Signed)
Calling to see if Dr. Maple Hudson rcvd her message for this PATIENT

## 2023-03-09 NOTE — Telephone Encounter (Signed)
I've sent script to try Tramadol for cough, 1 every 6 hours as needed. Can also add otc cough syrup like Delsym. Who is the "asthma doctor" she is going to see in [facility]??

## 2023-03-13 NOTE — Telephone Encounter (Signed)
Spoke with patient to try and get a earlier office visit per Dr.Young patient stated she will call our office back .

## 2023-03-19 NOTE — Progress Notes (Unsigned)
New Patient Note  RE: [name] MRN: [id number] DOB: [birth year] Date of Office Visit: 03/20/2023  Consult requested by: Waymon Budge, MD Primary care provider: Ladora Daniel, PA-C  Chief Complaint: No chief complaint on file.  History of Present Illness: I had the pleasure of seeing [name] for initial evaluation at the [facility] on 03/19/2023. She is a 57 y.o. female, who is referred here by Ladora Daniel, PA-C for the evaluation of allergic rhinitis.  Discussed the use of AI scribe software for clinical note transcription with the patient, who gave verbal consent to proceed.  History of Present Illness             She reports symptoms of ***. Symptoms have been going on for *** years. The symptoms are present *** all year around with worsening in ***. Other triggers include exposure to ***. Anosmia: ***. Headache: ***. She has used *** with ***fair improvement in symptoms. Sinus infections: ***. Previous work up includes: ***. Previous ENT evaluation: ***. Previous sinus imaging: ***. History of nasal polyps: ***. Last eye exam: ***. History of reflux: ***.  Assessment and Plan: [name] is a 57 y.o. female with: ***  Assessment and Plan               No follow-ups on file.  No orders of the defined types were placed in this encounter.  Lab Orders  No laboratory test(s) ordered today    Other allergy screening: Asthma: {Blank single:19197::"yes","no"} Rhino conjunctivitis: {Blank single:19197::"yes","no"} Food allergy: {Blank single:19197::"yes","no"} Medication allergy: {Blank single:19197::"yes","no"} Hymenoptera allergy: {Blank single:19197::"yes","no"} Urticaria: {Blank single:19197::"yes","no"} Eczema:{Blank single:19197::"yes","no"} History of recurrent infections suggestive of immunodeficency: {Blank single:19197::"yes","no"}  Diagnostics: Spirometry:  Tracings reviewed. Her effort: {Blank single:19197::"Good  reproducible efforts.","It was hard to get consistent efforts and there is a question as to whether this reflects a maximal maneuver.","Poor effort, data can not be interpreted."} FVC: ***L FEV1: ***L, ***% predicted FEV1/FVC ratio: ***% Interpretation: {Blank single:19197::"Spirometry consistent with mild obstructive disease","Spirometry consistent with moderate obstructive disease","Spirometry consistent with severe obstructive disease","Spirometry consistent with possible restrictive disease","Spirometry consistent with mixed obstructive and restrictive disease","Spirometry uninterpretable due to technique","Spirometry consistent with normal pattern","No overt abnormalities noted given today's efforts"}.  Please see scanned spirometry results for details.  Skin Testing: {Blank single:19197::"Select foods","Environmental allergy panel","Environmental allergy panel and select foods","Food allergy panel","None","Deferred due to recent antihistamines use"}. *** Results discussed with patient/family.   Past Medical History: Patient Active Problem List   Diagnosis Date Noted  . COPD mixed type (HCC) 12/07/2022  . Nocturnal hypoxia 07/17/2022  . Snoring 07/17/2022  . Hx of arteriovenous malformation (AVM) 07/17/2022  . Cough, persistent 10/03/2017  . Seasonal and perennial allergic rhinitis 10/03/2017  . Wheezing/coughing 10/03/2017  . Recurrent respiratory infections 10/03/2017  . Multiple thyroid nodules 03/15/2015  . Abdominal pain, chronic, right lower quadrant 11/20/2011  . Rectal bleeding 11/20/2011   Past Medical History:  Diagnosis Date  . ADD (attention deficit disorder)   . AVM (arteriovenous malformation)   . Hypertension    Past Surgical History: Past Surgical History:  Procedure Laterality Date  . BRAIN SURGERY  2021  . CESAREAN SECTION  05/30/1987  . HERNIA REPAIR  05/30/2011   Lap Ventral Hernia Repair  . PARTIAL HYSTERECTOMY  05/29/2010  . TUBAL LIGATION   05/29/1990  . VENTRAL HERNIA REPAIR  01/09/2012   Procedure: LAPAROSCOPIC VENTRAL HERNIA;  Surgeon: Mariella Saa, MD;  Location: [facility];  Service: General;  Laterality: N/A;  Laparoscopic Ventral Hernia Repair  . WISDOM TOOTH EXTRACTION  05/29/2001   Medication List:  Current Outpatient Medications  Medication Sig Dispense Refill  . albuterol (VENTOLIN HFA) 108 (90 Base) MCG/ACT inhaler Inhale 2 puffs into the lungs every 6 (six) hours as needed. (Patient not taking: Reported on 02/20/2023)    . atorvastatin (LIPITOR) 40 MG tablet Take 40 mg by mouth daily.    . benzonatate (TESSALON) 200 MG capsule Take 1 capsule (200 mg total) by mouth 3 (three) times daily as needed for cough. 30 capsule 1  . fluticasone (FLONASE) 50 MCG/ACT nasal spray Place 1 spray into both nostrils 2 (two) times daily. 16 g 2  . fluticasone (FLOVENT HFA) 220 MCG/ACT inhaler Inhale 2 puffs into the lungs 2 (two) times daily. (Patient not taking: Reported on 02/20/2023) 1 Inhaler 5  . Fluticasone Propionate (XHANCE) 93 MCG/ACT EXHU Place 2 puffs into the nose 2 (two) times daily. (Patient not taking: Reported on 02/20/2023) 32 mL 12  . Fluticasone-Umeclidin-Vilant (TRELEGY ELLIPTA) 100-62.5-25 MCG/ACT AEPB Inhale 1 puff into the lungs daily. 2 each 12  . lisinopril (ZESTRIL) 10 MG tablet Take 10 mg by mouth daily.    . methylphenidate 27 MG PO CR tablet Take 27 mg by mouth every morning.    . montelukast (SINGULAIR) 10 MG tablet Take 1 tablet (10 mg total) by mouth at bedtime. 30 tablet 5  . predniSONE (DELTASONE) 50 MG tablet Take 1 tab daily with breakfast for 3 days (Patient not taking: Reported on 02/20/2023) 3 tablet 0  . traMADol (ULTRAM) 50 MG tablet Take 1 tablet (50 mg total) by mouth every 6 (six) hours as needed. For cough 40 tablet 0   No current facility-administered medications for this visit.   Allergies: No Known Allergies Social History: Social History   Socioeconomic History  . Marital status:  Married    Spouse name: Not on file  . Number of children: Not on file  . Years of education: Not on file  . Highest education level: Not on file  Occupational History  . Not on file  Tobacco Use  . Smoking status: Former    Current packs/day: 0.00    Types: Cigarettes    Start date: 05/29/1985    Quit date: 05/29/1990    Years since quitting: 32.8  . Smokeless tobacco: Never  Vaping Use  . Vaping status: Never Used  Substance and Sexual Activity  . Alcohol use: No    Comment: rare occasion  . Drug use: No  . Sexual activity: Not on file  Other Topics Concern  . Not on file  Social History Narrative  . Not on file   Social Determinants of Health   Financial Resource Strain: Low Risk  (03/01/2023)   [facility]   Overall Financial Resource Strain (CARDIA)   . Difficulty of Paying Living Expenses: Not hard at all  Food Insecurity: No Food Insecurity (03/01/2023)   [facility] from [facility]   Hunger Vital Sign   . Worried About Programme researcher, broadcasting/film/video in the Last Year: Never true   . Ran Out of Food in the Last Year: Never true  Transportation Needs: No Transportation Needs (03/01/2023)   Received from [facility] - Transportation   . Lack of Transportation (Medical): No   . Lack of Transportation (Non-Medical): No  Physical Activity: Unknown (03/01/2023)   [facility] from [facility]   Exercise Vital Sign   . Days of Exercise  per Week: 0 days   . Minutes of Exercise per Session: Not on file  Stress: No Stress Concern Present (03/01/2023)   Received from [facility] of Occupational [facility] - Occupational Stress Questionnaire   . Feeling of Stress : Not at all  Social Connections: Moderately Integrated (03/01/2023)   Received from [facility]   Social Network   . How would you rate your social network (family, work, friends)?: Adequate participation with social networks   Lives in a ***. Smoking: *** Occupation:  ***  Environmental HistorySurveyor, minerals in the house: Copywriter, advertising in the family room: {Blank single:19197::"yes","no"} Carpet in the bedroom: {Blank single:19197::"yes","no"} Heating: {Blank single:19197::"electric","gas","heat pump"} Cooling: {Blank single:19197::"central","window","heat pump"} Pet: {Blank single:19197::"yes ***","no"}  Family History: Family History  Problem Relation Age of Onset  . Asthma Neg Hx   . Allergic rhinitis Neg Hx   . Angioedema Neg Hx   . Eczema Neg Hx   . Immunodeficiency Neg Hx   . Urticaria Neg Hx    Problem                               Relation Asthma                                   *** Eczema                                *** Food allergy                          *** Allergic rhino conjunctivitis     ***  Review of Systems  Constitutional:  Negative for appetite change, chills, fever and unexpected weight change.  HENT:  Negative for congestion and rhinorrhea.   Eyes:  Negative for itching.  Respiratory:  Negative for cough, chest tightness, shortness of breath and wheezing.   Cardiovascular:  Negative for chest pain.  Gastrointestinal:  Negative for abdominal pain.  Genitourinary:  Negative for difficulty urinating.  Skin:  Negative for rash.  Neurological:  Negative for headaches.   Objective: There were no vitals taken for this visit. There is no height or weight on file to calculate BMI. Physical Exam Vitals and nursing note reviewed.  Constitutional:      Appearance: Normal appearance. She is well-developed.  HENT:     Head: Normocephalic and atraumatic.     Right Ear: Tympanic membrane and external ear normal.     Left Ear: Tympanic membrane and external ear normal.     Nose: Nose normal.     Mouth/Throat:     Mouth: Mucous membranes are moist.     Pharynx: Oropharynx is clear.  Eyes:     Conjunctiva/sclera: Conjunctivae normal.  Cardiovascular:     Rate and Rhythm: Normal rate and  regular rhythm.     Heart sounds: Normal heart sounds. No murmur heard.    No friction rub. No gallop.  Pulmonary:     Effort: Pulmonary effort is normal.     Breath sounds: Normal breath sounds. No wheezing, rhonchi or rales.  Musculoskeletal:     Cervical back: Neck supple.  Skin:    General: Skin is warm.     Findings: No rash.  Neurological:  Mental Status: She is alert and oriented to person, place, and time.  Psychiatric:        Behavior: Behavior normal.  The plan was reviewed with the patient/family, and all questions/concerned were addressed.  It was my pleasure to see [name] today and participate in her care. Please feel free to contact me with any questions or concerns.  Sincerely,  Wyline Mood, DO Allergy & Immunology  [facility] office: [phone] [facility] office: [phone]

## 2023-03-20 ENCOUNTER — Ambulatory Visit (INDEPENDENT_AMBULATORY_CARE_PROVIDER_SITE_OTHER): Payer: BC Managed Care – PPO | Admitting: Allergy

## 2023-03-20 ENCOUNTER — Encounter: Payer: Self-pay | Admitting: Allergy

## 2023-03-20 VITALS — BP 124/86 | HR 52 | Temp 97.9°F | Resp 18 | Ht 64.0 in | Wt 301.0 lb

## 2023-03-20 DIAGNOSIS — B999 Unspecified infectious disease: Secondary | ICD-10-CM

## 2023-03-20 DIAGNOSIS — J3089 Other allergic rhinitis: Secondary | ICD-10-CM

## 2023-03-20 DIAGNOSIS — R053 Chronic cough: Secondary | ICD-10-CM | POA: Diagnosis not present

## 2023-03-20 DIAGNOSIS — J449 Chronic obstructive pulmonary disease, unspecified: Secondary | ICD-10-CM

## 2023-03-20 NOTE — Patient Instructions (Addendum)
Breathing Daily controller medication(s): Start Breztri 2 puffs twice a day with spacer and rinse mouth afterwards. Samples given.  Do this for 2 weeks.  Stop Spiriva.  May use albuterol rescue inhaler 2 puffs or nebulizer every 4 to 6 hours as needed for shortness of breath, chest tightness, coughing, and wheezing.  Monitor frequency of use - if you need to use it more than twice per week on a consistent basis let us know.  Breathing control goals:  Full participation in all desired activities (may need albuterol before activity) Albuterol use two times or less a week on average (not counting use with activity) Cough interfering with sleep two times or less a month Oral steroids no more than once a year No hospitalizations   Rhinitis  Return for environmental allergy skin testing. Will make additional recommendations based on results. Make sure you don't take any antihistamines for 3 days before the skin testing appointment. Don't put any lotion on the back and arms on the day of testing.  Plan on being here for 30-60 minutes.   Infections Keep track of infections and antibiotics use. Get bloodwork to look at immune system - will order at next visit depending on allergy testing results.   Follow up for skin testing.

## 2023-03-24 ENCOUNTER — Other Ambulatory Visit: Payer: Self-pay | Admitting: Family Medicine

## 2023-04-02 ENCOUNTER — Encounter (HOSPITAL_BASED_OUTPATIENT_CLINIC_OR_DEPARTMENT_OTHER): Payer: Self-pay

## 2023-04-03 ENCOUNTER — Encounter: Payer: Self-pay | Admitting: Allergy

## 2023-04-03 ENCOUNTER — Ambulatory Visit (INDEPENDENT_AMBULATORY_CARE_PROVIDER_SITE_OTHER): Payer: BC Managed Care – PPO | Admitting: Allergy

## 2023-04-03 DIAGNOSIS — J449 Chronic obstructive pulmonary disease, unspecified: Secondary | ICD-10-CM

## 2023-04-03 DIAGNOSIS — B999 Unspecified infectious disease: Secondary | ICD-10-CM

## 2023-04-03 DIAGNOSIS — J301 Allergic rhinitis due to pollen: Secondary | ICD-10-CM

## 2023-04-03 DIAGNOSIS — R053 Chronic cough: Secondary | ICD-10-CM

## 2023-04-03 DIAGNOSIS — J3089 Other allergic rhinitis: Secondary | ICD-10-CM | POA: Diagnosis not present

## 2023-04-03 NOTE — Patient Instructions (Addendum)
Today's skin testing positive to grass, weed, trees, mold, dust mites.  Results given.  Environmental allergies Start environmental control measures as below. Difficult to tell how much of the above allergens are contributing to your fall/spring time coughing symptoms. Use over the counter antihistamines such as Zyrtec (cetirizine), Claritin (loratadine), Allegra (fexofenadine), or Xyzal (levocetirizine) daily as needed. May take twice a day during allergy flares. May switch antihistamines every few months. Consider allergy injections for long term control if above medications do not help the symptoms - handout given.  2 shots.  Breathing Keep follow up with pulmonology. Daily controller medication(s): Breztri 2 puffs twice a day with spacer and rinse mouth afterwards. Finish samples given.  Stop Spiriva.  May use albuterol rescue inhaler 2 puffs or nebulizer every 4 to 6 hours as needed for shortness of breath, chest tightness, coughing, and wheezing.  Monitor frequency of use - if you need to use it more than twice per week on a consistent basis let us know.  Breathing control goals:  Full participation in all desired activities (may need albuterol before activity) Albuterol use two times or less a week on average (not counting use with activity) Cough interfering with sleep two times or less a month Oral steroids no more than once a year No hospitalizations   Infections Keep track of infections and antibiotics use. Get bloodwork to look at immune system.  We are ordering labs, so please allow 1-2 weeks for the results to come back. With the newly implemented Cures Act, the labs might be visible to you at the same time that they become visible to me. However, I will not address the results until all of the results are back, so please be patient.  In the meantime, continue recommendations in your patient instructions, including avoidance measures (if applicable), until you hear from  me.  Return in about 4 months (around 08/01/2023). Or sooner if needed.   Reducing Pollen Exposure Pollen seasons: trees (spring), grass (summer) and ragweed/weeds (fall). Keep windows closed in your home and car to lower pollen exposure.  Install air conditioning in the bedroom and throughout the house if possible.  Avoid going out in dry windy days - especially early morning. Pollen counts are highest between 5 - 10 AM and on dry, hot and windy days.  Save outside activities for late afternoon or after a heavy rain, when pollen levels are lower.  Avoid mowing of grass if you have grass pollen allergy. Be aware that pollen can also be transported indoors on people and pets.  Dry your clothes in an automatic dryer rather than hanging them outside where they might collect pollen.  Rinse hair and eyes before bedtime.  Mold Control Mold and fungi can grow on a variety of surfaces provided certain temperature and moisture conditions exist.  Outdoor molds grow on plants, decaying vegetation and soil. The major outdoor mold, Alternaria and Cladosporium, are found in very high numbers during hot and dry conditions. Generally, a late summer - fall peak is seen for common outdoor fungal spores. Rain will temporarily lower outdoor mold spore count, but counts rise rapidly when the rainy period ends. The most important indoor molds are Aspergillus and Penicillium. Dark, humid and poorly ventilated basements are ideal sites for mold growth. The next most common sites of mold growth are the bathroom and the kitchen. Outdoor (Seasonal) Mold Control Use air conditioning and keep windows closed. Avoid exposure to decaying vegetation. Avoid leaf raking. Avoid grain handling. Consider  wearing a face mask if working in moldy areas.  Indoor (Perennial) Mold Control  Maintain humidity below 50%. Get rid of mold growth on hard surfaces with water, detergent and, if necessary, 5% bleach (do not mix with other  cleaners). Then dry the area completely. If mold covers an area more than 10 square feet, consider hiring an indoor environmental professional. For clothing, washing with soap and water is best. If moldy items cannot be cleaned and dried, throw them away. Remove sources e.g. contaminated carpets. Repair and seal leaking roofs or pipes. Using dehumidifiers in damp basements may be helpful, but empty the water and clean units regularly to prevent mildew from forming. All rooms, especially basements, bathrooms and kitchens, require ventilation and cleaning to deter mold and mildew growth. Avoid carpeting on concrete or damp floors, and storing items in damp areas.  Control of House Dust Mite Allergen Dust mite allergens are a common trigger of allergy and asthma symptoms. While they can be found throughout the house, these microscopic creatures thrive in warm, humid environments such as bedding, upholstered furniture and carpeting. Because so much time is spent in the bedroom, it is essential to reduce mite levels there.  Encase pillows, mattresses, and box springs in special allergen-proof fabric covers or airtight, zippered plastic covers.  Bedding should be washed weekly in hot water (130 F) and dried in a hot dryer. Allergen-proof covers are available for comforters and pillows that can't be regularly washed.  Wash the allergy-proof covers every few months. Minimize clutter in the bedroom. Keep pets out of the bedroom.  Keep humidity less than 50% by using a dehumidifier or air conditioning. You can buy a humidity measuring device called a hygrometer to monitor this.  If possible, replace carpets with hardwood, linoleum, or washable area rugs. If that's not possible, vacuum frequently with a vacuum that has a HEPA filter. Remove all upholstered furniture and non-washable window drapes from the bedroom. Remove all non-washable stuffed toys from the bedroom.  Wash stuffed toys weekly.

## 2023-04-03 NOTE — Progress Notes (Signed)
Skin testing note  RE: [name] MRN: [id number] DOB: May 20, 1966 Date of Office Visit: 04/03/2023  Referring provider: Ladora Daniel, PA-C Primary care provider: Ladora Daniel, PA-C  Chief Complaint: No chief complaint on file.  History of Present Illness: I had the pleasure of seeing [name] for a skin testing visit at the [facility] on 04/03/2023. She is a 57 y.o. female, who is being followed for chronic coughing, COPD, allergic rhinitis, recurrent infections. Her previous allergy office visit was on 03/20/2023 with Dr. Selena Batten. Today is a skin testing visit.   Discussed the use of AI scribe software for clinical note transcription with the patient, who gave verbal consent to proceed.  She reports a seasonal pattern of coughing, particularly in the fall, and occasionally in the spring. The coughing has improved since starting Breztri but normally it resolves around this time of the year.  The patient is scheduled to see a new pulmonologist and is considering staying on the current inhaler regimen. She has not noticed any adverse effects from the current inhaler and believes it has contributed to a reduction in her coughing.   Assessment and Plan: [name] is a 57 y.o. female with: Other allergic rhinitis Seasonal allergic rhinitis due to pollen Allergic rhinitis due to dust mite Allergic rhinitis due to mold Past history - 2019 skin testing positive to grass, trees, dust mites and cat.  Today's skin testing positive to grass, weed, trees, mold, dust mites. Start environmental control measures as below. Difficult to tell how much of the above allergens are contributing to your fall/spring time coughing symptoms. Use over the counter antihistamines such as Zyrtec (cetirizine), Claritin (loratadine), Allegra (fexofenadine), or Xyzal (levocetirizine) daily as needed. May take twice a day during allergy flares. May switch antihistamines every few months. Consider  allergy injections for long term control if above medications do not help the symptoms - handout given. 2 injections.  Recurrent infections Past history - frequent bouts of pneumonias and bronchitis.  Keep track of infections and antibiotics use. Get bloodwork to look at immune system.   COPD mixed type (HCC) Chronic coughing Past history - Seasonal cough for the past 20 years, typically in the fall and sometimes in the spring. Dry cough lasting for at least two months requiring systemic steroids once per year. No associated rhinorrhea, sneezing, or itchy/watery eyes. History of multiple pneumonias and bronchitis. Follows with pulm for COPD. 2024 spirometry was normal with 3% improvement in FEV1 post bronchodilator treatment. Clinically feeling unchanged - persistent coughing.  Interim history - coughing much better but not sure if it's going to resolve or if [name] is helping. Didn't tolerate Trelegy.  Keep follow up with pulmonology. Daily controller medication(s): Breztri 2 puffs twice a day with spacer and rinse mouth afterwards. Finish samples given.  Stop Spiriva.  May use albuterol rescue inhaler 2 puffs or nebulizer every 4 to 6 hours as needed for shortness of breath, chest tightness, coughing, and wheezing.  Monitor frequency of use - if you need to use it more than twice per week on a consistent basis let us know.  Return in about 4 months (around 08/01/2023).  No orders of the defined types were placed in this encounter.  Lab Orders         CBC with Differential/Platelet         Complement, total         IgG, IgA, IgM  Diphtheria / Tetanus Antibody Panel         Strep pneumoniae 23 Serotypes IgG     Diagnostics: Skin Testing: Environmental allergy panel. Today's skin testing positive to grass, weed, trees, mold, dust mites. Results discussed with patient/family.  Airborne Adult Perc - 04/03/23 1351     Time Antigen Placed 1352    Allergen Manufacturer Waynette Buttery     Location Back    Number of Test 55    1. Control-Buffer 50% Glycerol Negative    2. Control-Histamine 3+    3. Bahia 2+    4. French Southern [place] Negative    5. Johnson Negative    6. Kentucky Blue 2+    7. Meadow Fescue 2+    8. Perennial Rye Negative    9. Timothy 2+    10. Ragweed Mix Negative    11. Cocklebur Negative    12. Plantain,  English 2+    13. Baccharis Negative    14. Dog Fennel Negative    15. Russian Thistle Negative    16. Lamb's Quarters Negative    17. Sheep Sorrell --   +/-   18. Rough Pigweed Negative    19. Marsh Elder, Rough 2+    20. Mugwort, Common Negative    21. Box, Elder Negative    22. Cedar, red --   +/-   23. Sweet Gum --   +/-   24. Pecan Pollen Negative    25. Pine Mix Negative    26. Walnut, Black Pollen Negative    27. Red Mulberry Negative    28. Ash Mix 2+    29. Birch Mix Negative    30. Beech American Negative    31. Cottonwood, Guinea-Bissau Negative    32. Hickory, White 2+    33. Maple Mix Negative    34. Oak, Guinea[place] Mix --   +/-   35. Sycamore Eastern 2+    36. Alternaria Alternata 2+    37. Cladosporium Herbarum Negative    38. Aspergillus Mix Negative    39. Penicillium Mix Negative    40. Bipolaris Sorokiniana (Helminthosporium) Negative    41. Drechslera Spicifera (Curvularia) Negative    42. Mucor Plumbeus Negative    43. Fusarium Moniliforme Negative    44. Aureobasidium Pullulans (pullulara) Negative    45. Rhizopus Oryzae Negative    46. Botrytis Cinera Negative    47. Epicoccum Nigrum Negative    48. Phoma Betae Negative    49. Dust Mite Mix 2+    50. Cat Hair 10,000 BAU/ml Negative    51.  Dog Epithelia Negative    52. Mixed Feathers Negative    53. Horse Epithelia Negative    54. Cockroach, German Negative    55. Tobacco Leaf Negative             Intradermal - 04/03/23 1440     Time Antigen Placed 1440    Allergen Manufacturer Greer    Location Arm    Number of Test 10    Control Negative    [place] Negative     Johnson Negative    Ragweed Mix Negative    Mold 2 Negative    Mold 3 Negative    Mold 4 Negative    Cat Negative    Dog Negative    Cockroach Negative             Previous notes and tests were reviewed. The plan was reviewed with the patient/family, and all questions/concerned  were addressed.  It was my pleasure to see [name] today and participate in her care. Please feel free to contact me with any questions or concerns.  Sincerely,  Wyline Mood, DO Allergy & Immunology  [facility] office: [phone] [facility] office: [phone]

## 2023-04-09 LAB — CBC WITH DIFFERENTIAL/PLATELET
Basophils Absolute: 0.1 10*3/uL (ref 0.0–0.2)
Basos: 1 %
EOS (ABSOLUTE): 0.2 10*3/uL (ref 0.0–0.4)
Eos: 2 %
Hematocrit: 43.4 % (ref 34.0–46.6)
Hemoglobin: 13.9 g/dL (ref 11.1–15.9)
Immature Grans (Abs): 0.1 10*3/uL (ref 0.0–0.1)
Immature Granulocytes: 1 %
Lymphocytes Absolute: 1.8 10*3/uL (ref 0.7–3.1)
Lymphs: 19 %
MCH: 28.9 pg (ref 26.6–33.0)
MCHC: 32 g/dL (ref 31.5–35.7)
MCV: 90 fL (ref 79–97)
Monocytes Absolute: 0.8 10*3/uL (ref 0.1–0.9)
Monocytes: 9 %
Neutrophils Absolute: 6.1 10*3/uL (ref 1.4–7.0)
Neutrophils: 68 %
Platelets: 283 10*3/uL (ref 150–450)
RBC: 4.81 x10E6/uL (ref 3.77–5.28)
RDW: 13.4 % (ref 11.7–15.4)
WBC: 9 10*3/uL (ref 3.4–10.8)

## 2023-04-09 LAB — STREP PNEUMONIAE 23 SEROTYPES IGG
Pneumo Ab Type 1*: 12.8 ug/mL (ref >1.3)
Pneumo Ab Type 12 (12F)*: 0.7 ug/mL — ABNORMAL LOW (ref >1.3)
Pneumo Ab Type 14*: 26.1 ug/mL (ref >1.3)
Pneumo Ab Type 17 (17F)*: 1.2 ug/mL — ABNORMAL LOW (ref >1.3)
Pneumo Ab Type 19 (19F)*: 3.8 ug/mL (ref >1.3)
Pneumo Ab Type 2*: 0.5 ug/mL — ABNORMAL LOW (ref >1.3)
Pneumo Ab Type 20*: 9 ug/mL (ref >1.3)
Pneumo Ab Type 22 (22F)*: 3.9 ug/mL (ref >1.3)
Pneumo Ab Type 23 (23F)*: 10.9 ug/mL (ref >1.3)
Pneumo Ab Type 26 (6B)*: 6.9 ug/mL (ref >1.3)
Pneumo Ab Type 3*: 0.4 ug/mL — ABNORMAL LOW (ref >1.3)
Pneumo Ab Type 34 (10A)*: 10.4 ug/mL (ref >1.3)
Pneumo Ab Type 4*: 1.6 ug/mL (ref >1.3)
Pneumo Ab Type 43 (11A)*: 1.6 ug/mL (ref >1.3)
Pneumo Ab Type 5*: 1.4 ug/mL (ref >1.3)
Pneumo Ab Type 51 (7F)*: 4.8 ug/mL (ref >1.3)
Pneumo Ab Type 54 (15B)*: 3.6 ug/mL (ref >1.3)
Pneumo Ab Type 56 (18C)*: 12 ug/mL (ref >1.3)
Pneumo Ab Type 57 (19A)*: 7.6 ug/mL (ref >1.3)
Pneumo Ab Type 68 (9V)*: 5.5 ug/mL (ref >1.3)
Pneumo Ab Type 70 (33F)*: >20.2 ug/mL (ref >1.3)
Pneumo Ab Type 8*: 2 ug/mL (ref >1.3)
Pneumo Ab Type 9 (9N)*: 0.8 ug/mL — ABNORMAL LOW (ref >1.3)

## 2023-04-09 LAB — IGG, IGA, IGM
IgA/Immunoglobulin A, Serum: 225 mg/dL (ref 87–352)
IgG (Immunoglobin G), Serum: 1006 mg/dL (ref 586–1602)
IgM (Immunoglobulin M), Srm: 74 mg/dL (ref 26–217)

## 2023-04-09 LAB — DIPHTHERIA / TETANUS ANTIBODY PANEL
Diphtheria Ab: 1.14 [IU]/mL (ref <0.10)
Tetanus Ab, IgG: 1.09 [IU]/mL (ref <0.10)

## 2023-04-09 LAB — COMPLEMENT, TOTAL: Compl, Total (CH50): >60 U/mL (ref >41)

## 2023-05-22 ENCOUNTER — Ambulatory Visit: Payer: BC Managed Care – PPO | Admitting: Internal Medicine

## 2023-07-09 ENCOUNTER — Encounter (INDEPENDENT_AMBULATORY_CARE_PROVIDER_SITE_OTHER): Payer: Self-pay | Admitting: Otolaryngology

## 2023-07-30 NOTE — Progress Notes (Deleted)
 Follow Up Note  RE: [name] MRN: [id number] DOB: May 22, 1966 Date of Office Visit: 07/31/2023  Referring provider: Ladora Daniel, PA-C Primary care provider: Ladora Daniel, PA-C  Chief Complaint: No chief complaint on file.  History of Present Illness: I had the pleasure of seeing [name] for a follow up visit at the [facility] on 07/30/2023. She is a 58 y.o. female, who is being followed for allergic rhinitis, recurrent infections, COPD and chronic coughing. Her previous allergy office visit was on 04/03/2023 with Dr. Selena Rodriguez. Today is a regular follow up visit.  Discussed the use of AI scribe software for clinical note transcription with the patient, who gave verbal consent to proceed.  History of Present Illness            I reviewed your labwork from 2024. Your blood count, immunoglobulin levels were normal which is great. You also have good protection against diptheria and tetanus. Your pneumococcal titers are protective against strep pneumoniae which is great.   Based on these lab results no immunodeficiency noted.   Keep track of infections and antibiotics use.   Keep follow up in March.  Assessment and Plan: [name] is a 58 y.o. female with: Seasonal allergic rhinitis due to pollen Allergic rhinitis due to dust mite Allergic rhinitis due to mold Past history - 2019 skin testing positive to grass, trees, dust mites and cat. 2024 skin testing positive to grass, weed, trees, mold, dust mites. Continue environmental control measures as below. Difficult to tell how much of the above allergens are contributing to your fall/spring time coughing symptoms. Use over the counter antihistamines such as Zyrtec (cetirizine), Claritin (loratadine), Allegra (fexofenadine), or Xyzal (levocetirizine) daily as needed. May take twice a day during allergy flares. May switch antihistamines every few months. Consider allergy injections for long term control if above  medications do not help the symptoms - handout given. 2 injections.   Recurrent infections Past history - frequent bouts of pneumonias and bronchitis.  Keep track of infections and antibiotics use. Get bloodwork to look at immune system.    COPD mixed type (HCC) Chronic coughing Past history - Seasonal cough for the past 20 years, typically in the fall and sometimes in the spring. Dry cough lasting for at least two months requiring systemic steroids once per year. No associated rhinorrhea, sneezing, or itchy/watery eyes. History of multiple pneumonias and bronchitis. Follows with pulm for COPD. 2024 spirometry was normal with 3% improvement in FEV1 post bronchodilator treatment. Clinically feeling unchanged - persistent coughing.  Interim history - coughing much better but not sure if it's going to resolve or if [name] is helping. Didn't tolerate Trelegy.  Keep follow up with pulmonology. Daily controller medication(s): Breztri 2 puffs twice a day with spacer and rinse mouth afterwards. Finish samples given.  Stop Spiriva.  May use albuterol rescue inhaler 2 puffs or nebulizer every 4 to 6 hours as needed for shortness of breath, chest tightness, coughing, and wheezing.  Monitor frequency of use - if you need to use it more than twice per week on a consistent basis let us know. Assessment and Plan              No follow-ups on file.  No orders of the defined types were placed in this encounter.  Lab Orders  No laboratory test(s) ordered today    Diagnostics: Spirometry:  Tracings reviewed. Her effort: {Blank single:19197::"Good reproducible efforts.","It was hard to get consistent efforts and  there is a question as to whether this reflects a maximal maneuver.","Poor effort, data can not be interpreted."} FVC: ***L FEV1: ***L, ***% predicted FEV1/FVC ratio: ***% Interpretation: {Blank single:19197::"Spirometry consistent with mild obstructive disease","Spirometry consistent with  moderate obstructive disease","Spirometry consistent with severe obstructive disease","Spirometry consistent with possible restrictive disease","Spirometry consistent with mixed obstructive and restrictive disease","Spirometry uninterpretable due to technique","Spirometry consistent with normal pattern","No overt abnormalities noted given today's efforts"}.  Please see scanned spirometry results for details.  Skin Testing: {Blank single:19197::"Select foods","Environmental allergy panel","Environmental allergy panel and select foods","Food allergy panel","None","Deferred due to recent antihistamines use"}. *** Results discussed with patient/family.   Medication List:  Current Outpatient Medications  Medication Sig Dispense Refill   albuterol (VENTOLIN HFA) 108 (90 Base) MCG/ACT inhaler Inhale 2 puffs into the lungs every 6 (six) hours as needed.     escitalopram (LEXAPRO) 20 MG tablet Take 20 mg by mouth daily.     levalbuterol (XOPENEX) 1.25 MG/3ML nebulizer solution Take 3 mLs (1.25 mg dose) by nebulization every 4 (four) hours as needed for Shortness of Breath.     lisinopril (ZESTRIL) 10 MG tablet Take 10 mg by mouth daily.     methylphenidate 27 MG PO CR tablet Take 36 mg by mouth every morning.     mirabegron ER (MYRBETRIQ) 50 MG TB24 tablet Take by mouth.     rosuvastatin (CRESTOR) 10 MG tablet Take by mouth.     Tiotropium Bromide Monohydrate (SPIRIVA RESPIMAT) 1.25 MCG/ACT AERS Inhale into the lungs.     No current facility-administered medications for this visit.   Allergies: No Known Allergies I reviewed her past medical history, social history, family history, and environmental history and no significant changes have been reported from her previous visit.  Review of Systems  Constitutional:  Negative for appetite change, chills, fever and unexpected weight change.  HENT:  Negative for congestion and rhinorrhea.   Eyes:  Negative for itching.  Respiratory:  Positive for  cough, chest tightness, shortness of breath and wheezing.   Cardiovascular:  Negative for chest pain.  Gastrointestinal:  Negative for abdominal pain.  Genitourinary:  Negative for difficulty urinating.  Skin:  Negative for rash.  Allergic/Immunologic: Positive for environmental allergies.  Neurological:  Positive for headaches.    Objective: There were no vitals taken for this visit. There is no height or weight on file to calculate BMI. Physical Exam Vitals and nursing note reviewed.  Constitutional:      Appearance: Normal appearance. She is well-developed. She is obese.  HENT:     Head: Normocephalic and atraumatic.     Right Ear: Tympanic membrane and external ear normal.     Left Ear: Tympanic membrane and external ear normal.     Nose: Congestion (left side nasal congestion) present.     Mouth/Throat:     Mouth: Mucous membranes are moist.     Pharynx: Oropharynx is clear.  Eyes:     Conjunctiva/sclera: Conjunctivae normal.  Cardiovascular:     Rate and Rhythm: Normal rate and regular rhythm.     Heart sounds: Normal heart sounds. No murmur heard.    No friction rub. No gallop.  Pulmonary:     Effort: Pulmonary effort is normal.     Breath sounds: Normal breath sounds. No wheezing, rhonchi or rales.  Musculoskeletal:     Cervical back: Neck supple.  Skin:    General: Skin is warm.     Findings: No rash.  Neurological:     Mental Status: She  is alert and oriented to person, place, and time.  Psychiatric:        Behavior: Behavior normal.    Previous notes and tests were reviewed. The plan was reviewed with the patient/family, and all questions/concerned were addressed.  It was my pleasure to see [name] today and participate in her care. Please feel free to contact me with any questions or concerns.  Sincerely,  Wyline Mood, DO Allergy & Immunology  [facility] office: [phone] [facility] office: [phone]

## 2023-07-31 ENCOUNTER — Ambulatory Visit: Payer: BC Managed Care – PPO | Admitting: Allergy

## 2024-02-27 ENCOUNTER — Ambulatory Visit (INDEPENDENT_AMBULATORY_CARE_PROVIDER_SITE_OTHER): Admitting: Student

## 2024-02-27 ENCOUNTER — Encounter (HOSPITAL_BASED_OUTPATIENT_CLINIC_OR_DEPARTMENT_OTHER): Payer: Self-pay | Admitting: Student

## 2024-02-27 ENCOUNTER — Ambulatory Visit (INDEPENDENT_AMBULATORY_CARE_PROVIDER_SITE_OTHER)

## 2024-02-27 DIAGNOSIS — M1711 Unilateral primary osteoarthritis, right knee: Secondary | ICD-10-CM | POA: Diagnosis not present

## 2024-02-27 DIAGNOSIS — M25561 Pain in right knee: Secondary | ICD-10-CM | POA: Diagnosis not present

## 2024-02-27 MED ORDER — TRIAMCINOLONE ACETONIDE 40 MG/ML IJ SUSP
2.0000 mL | INTRAMUSCULAR | Status: AC | PRN
  Administered 2024-02-27: 2 mL via INTRA_ARTICULAR

## 2024-02-27 MED ORDER — LIDOCAINE HCL 1 % IJ SOLN
4.0000 mL | INTRAMUSCULAR | Status: AC | PRN
  Administered 2024-02-27: 4 mL

## 2024-02-27 NOTE — Progress Notes (Signed)
 Chief Complaint: Right knee pain    Discussed the use of AI scribe software for clinical note transcription with the patient, who gave verbal consent to proceed.  History of Present Illness [name] is a pleasant 58 year old female who presents with right knee pain following a fall.  She fell down five stairs at home, hitting a dog kennel and landing on the floor, resulting in right knee pain. The pain is primarily over the anterior and posterior knee and worsens with walking. Elevation alleviates the pain. She uses Tylenol  and ice, which reduce swelling, but pain persists, especially with ambulation. She has arthritis in her knees and has had Baker's cysts drained twice, likely in the right knee. She has received gel injections in both knees, with three in the left and one in the right. Pain is exacerbated by walking, stair navigation, and keeping the knee bent. There is no popping, clicking, or locking of the knee.   Surgical History:   None  PMH/PSH/Family History/Social History/Meds/Allergies:    Past Medical History:  Diagnosis Date   ADD (attention deficit disorder)    AVM (arteriovenous malformation)    Hypertension    Past Surgical History:  Procedure Laterality Date   BRAIN SURGERY  2021   CESAREAN SECTION  05/30/1987   HERNIA REPAIR  05/30/2011   Lap Ventral Hernia Repair   PARTIAL HYSTERECTOMY  05/29/2010   TUBAL LIGATION  05/29/1990   VENTRAL HERNIA REPAIR  01/09/2012   Procedure: LAPAROSCOPIC VENTRAL HERNIA;  Surgeon: Morene ONEIDA Olives, MD;  Location: [facility];  Service: General;  Laterality: N/A;  Laparoscopic Ventral Hernia Repair   WISDOM TOOTH EXTRACTION  05/29/2001   Social History   Socioeconomic History   Marital status: Married    Spouse name: Not on file   Number of children: Not on file   Years of education: Not on file   Highest education level: Not on file  Occupational History   Not on file  Tobacco Use    Smoking status: Former    Current packs/day: 0.00    Types: Cigarettes    Start date: 05/29/1985    Quit date: 05/29/1990    Years since quitting: 33.7   Smokeless tobacco: Never  Vaping Use   Vaping status: Never Used  Substance and Sexual Activity   Alcohol use: No    Comment: rare occasion   Drug use: No   Sexual activity: Not on file  Other Topics Concern   Not on file  Social History Narrative   Not on file   Social Drivers of Health   Financial Resource Strain: Low Risk  (10/01/2023)   Received from [facility]   Overall Financial Resource Strain (CARDIA)    Difficulty of Paying Living Expenses: Not hard at all  Food Insecurity: No Food Insecurity (10/01/2023)   [facility]   Hunger Vital Sign    Within the past 12 months, you worried that your food would run out before you got the money to buy more.: Never true    Within the past 12 months, the food you bought just didn't last and you didn't have money to get more.: Never true  Transportation Needs: No Transportation Needs (10/01/2023)   [facility]  Lack of Transportation (Medical): No    Lack of Transportation (Non-Medical): No  Physical Activity: Unknown (10/01/2023)   Received from [facility]   Exercise Vital Sign    On average, how many days per week do you engage in moderate to strenuous exercise (like a brisk walk)?: 2 days    Minutes of Exercise per Session: Not on file  Stress: No Stress Concern Present (10/01/2023)   Received from [facility] - Occupational Stress Questionnaire    Feeling of Stress : Only a little  Social Connections: Moderately Integrated (10/01/2023)   Received from [facility]   Social Network    How would you rate your social network (family, work, friends)?: Adequate participation with social networks   Family History  Problem Relation Age of Onset   Asthma Son    Asthma  Daughter    Asthma Granddaughter    Allergic rhinitis Neg Hx    Angioedema Neg Hx    Eczema Neg Hx    Immunodeficiency Neg Hx    Urticaria Neg Hx    No Known Allergies Current Outpatient Medications  Medication Sig Dispense Refill   albuterol  (VENTOLIN  HFA) 108 (90 Base) MCG/ACT inhaler Inhale 2 puffs into the lungs every 6 (six) hours as needed.     escitalopram (LEXAPRO) 20 MG tablet Take 20 mg by mouth daily.     levalbuterol (XOPENEX) 1.25 MG/3ML nebulizer solution Take 3 mLs (1.25 mg dose) by nebulization every 4 (four) hours as needed for Shortness of Breath.     lisinopril (ZESTRIL) 10 MG tablet Take 10 mg by mouth daily.     methylphenidate  27 MG PO CR tablet Take 36 mg by mouth every morning.     mirabegron ER (MYRBETRIQ) 50 MG TB24 tablet Take by mouth.     rosuvastatin (CRESTOR) 10 MG tablet Take by mouth.     Tiotropium Bromide Monohydrate (SPIRIVA RESPIMAT) 1.25 MCG/ACT AERS Inhale into the lungs.     No current facility-administered medications for this visit.   No results found.  Review of Systems:   A ROS was performed including pertinent positives and negatives as documented in the HPI.  Physical Exam :   Constitutional: NAD and appears stated age Neurological: Alert and oriented Psych: Appropriate affect and cooperative There were no vitals taken for this visit.   Comprehensive Musculoskeletal Exam:    Evaluation of the right knee demonstrates tenderness over both the medial and lateral joint line.  Mild tenderness and ecchymosis overlying the patella.  There is a Baker's cyst in the posterior knee which is tender upon palpation.  Active range of motion from 0 to 90 degrees.  No overlying erythema or warmth.  Imaging:   Xray (right knee 3 views): Negative for acute fracture or dislocation.  Mild tibiofemoral and moderate patellofemoral compartment osteoarthritis.   I personally reviewed and interpreted the radiographs.      Assessment & Plan Right  knee pain after fall Patient sustained a fall 3 days ago down 5 stairs, impacting the anterior right knee.  X-rays today appear negative for any acute fracture.  She does have underlying osteoarthritis, particular in the patellofemoral compartment which I believe has been flared up due to the recent fall.  She does have a Baker's cyst and has undergone drainage in previous years so we will continue to monitor this and can refer her for drainage if needed.  Offered and administered cortisone injection into the right knee  to help reduce pain and inflammation.  Can plan for follow-up as needed.     Procedure Note  Patient: [name]             Date of Birth: [birth year]           MRN: [id number]             Visit Date: 02/27/2024  Procedures: Visit Diagnoses:  1. Acute pain of right knee   2. Unilateral primary osteoarthritis, right knee     Large Joint Inj: R knee on 02/27/2024 11:56 AM Indications: pain Details: 22 G 1.5 in needle, anterolateral approach Medications: 4 mL lidocaine  1 %; 2 mL triamcinolone acetonide 40 MG/ML Outcome: tolerated well, no immediate complications Procedure, treatment alternatives, risks and benefits explained, specific risks discussed. Consent was given by the patient. Immediately prior to procedure a time out was called to verify the correct patient, procedure, equipment, support staff and site/side marked as required. Patient was prepped and draped in the usual sterile fashion.        I personally saw and evaluated the patient, and participated in the management and treatment plan.  Leonce Reveal, PA-C Orthopedics

## 2024-03-31 ENCOUNTER — Encounter: Payer: Self-pay | Admitting: Radiology

## 2024-05-02 ENCOUNTER — Ambulatory Visit: Admitting: Allergy & Immunology

## 2024-05-02 ENCOUNTER — Encounter: Payer: Self-pay | Admitting: Allergy & Immunology

## 2024-05-02 VITALS — BP 128/88 | HR 93 | Temp 98.0°F | Ht 63.39 in | Wt 303.5 lb

## 2024-05-02 DIAGNOSIS — J302 Other seasonal allergic rhinitis: Secondary | ICD-10-CM

## 2024-05-02 DIAGNOSIS — J3089 Other allergic rhinitis: Secondary | ICD-10-CM | POA: Diagnosis not present

## 2024-05-02 DIAGNOSIS — J449 Chronic obstructive pulmonary disease, unspecified: Secondary | ICD-10-CM

## 2024-05-02 DIAGNOSIS — B999 Unspecified infectious disease: Secondary | ICD-10-CM

## 2024-05-02 NOTE — Patient Instructions (Addendum)
 1. Seasonal and perennial allergic rhinitis - We will plan start allergy  shots. - This will be one vial and one injection. - Consent signed today.  - EpiPen  prescribed. - Emergency Action Plan provided.  - We will mix the vials before the end of December, but rush immunotherapy will take place in January or February.   2. COPD mixed - Continue to follow with Dr. Clotilda. - I will end my note to him to keep you guys in the loop. - I hope we figure out what is causing the ground glass opacities.  3. Recurrent infections  - This seems to be stable, although your fall illness lasts several weeks. - I am going to get some more labs to look at your immune markers.   4. Return in about 3 months (around 07/31/2024). You can have the follow up appointment with Dr. Iva or a Nurse Practicioner (our Nurse Practitioners are excellent and always have Physician oversight!).    Please inform us  of any Emergency Department visits, hospitalizations, or changes in symptoms. Call us  before going to the ED for breathing or allergy  symptoms since we might be able to fit you in for a sick visit. Feel free to contact us  anytime with any questions, problems, or concerns.  It was a pleasure to meet you today!  Websites that have reliable patient information: 1. American Academy of Asthma, Allergy , and Immunology: www.aaaai.org 2. Food Allergy  Research and Education (FARE): foodallergy.org 3. Mothers of Asthmatics: http://www.asthmacommunitynetwork.org 4. American College of Allergy , Asthma, and Immunology: www.acaai.org      "Like" us  on Facebook and Instagram for our latest updates!      A healthy democracy works best when [employer] participate! Make sure you are registered to vote! If you have moved or changed any of your contact information, you will need to get this updated before voting! Scan the QR codes below to learn more!

## 2024-05-02 NOTE — Progress Notes (Signed)
 FOLLOW UP  Date of Service/Encounter:  05/02/24   Assessment:   Seasonal and perennial allergic rhinitis  COPD   Recurrent infections  Plan/Recommendations:   1. Seasonal and perennial allergic rhinitis - We will plan start allergy  shots. - This will be one vial and one injection. - Consent signed today.  - EpiPen  prescribed. - Emergency Action Plan provided.  - We will mix the vials before the end of December, but rush immunotherapy will take place in January or February.   2. COPD mixed - Continue to follow with Dr. Clotilda. - I will end my note to him to keep you guys in the loop. - I hope we figure out what is causing the ground glass opacities.  3. Recurrent infections  - This seems to be stable, although your fall illness lasts several weeks. - I am going to get some more labs to look at your immune markers.   4. Return in about 3 months (around 07/31/2024). You can have the follow up appointment with Dr. Iva or a Nurse Practicioner (our Nurse Practitioners are excellent and always have Physician oversight!).   Subjective:   [name] is a 58 y.o. female presenting today for follow up of  Chief Complaint  Patient presents with   Follow-up    Talk about allergy  injections    [name] has a history of the following: Patient Active Problem List   Diagnosis Date Noted   COPD mixed type (HCC) 12/07/2022   Nocturnal hypoxia 07/17/2022   Snoring 07/17/2022   Hx of arteriovenous malformation (AVM) 07/17/2022   Cough, persistent 10/03/2017   Seasonal and perennial allergic rhinitis 10/03/2017   Wheezing/coughing 10/03/2017   Recurrent respiratory infections 10/03/2017   Multiple thyroid  nodules 03/15/2015   Abdominal pain, chronic, right lower quadrant 11/20/2011   Rectal bleeding 11/20/2011    History obtained from: chart review and patient.  Discussed the use of AI scribe software for clinical note transcription with the patient and/or  guardian, who gave verbal consent to proceed.  [name] is a 58 y.o. female presenting for a follow up visit.  She was last seen in November 2024 by Dr. Luke.  At that time, she was continued on Breztri 2 puffs twice daily.  She had been on Trelegy in the past but did not tolerate it.  For her recurrent infections, it was recommended that she get blood work to evaluate her immune system.  Her allergic rhinitis, her symptoms are positive to grasses, trees, dust mite, and cat.  She was continued on over-the-counter antihistamine.  Since the last visit, she has largely done well.    She has been diagnosed with COPD and has ground glass opacities in her lungs, which have been investigated with multiple tests, including CT scans and swallow tests. The opacities have shifted locations over time, now primarily on the left side. She has undergone various tests, including a swallow test and a cancer test, which were negative for cancer.  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 She experienced severe headaches and nausea at the time. She was unable to drive or perform daily activities for six months post-surgery. Since the surgery, she has experienced difficulties with word recall and memory.  She reports a large thyroid  nodule that has been biopsied and is not cancerous. She is experiencing swallowing difficulties. No acid reflux, but she sleeps in a recliner due to discomfort when lying flat.  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 She has excellent insurance coverage through her husband's employment, which has been beneficial for her extensive medical needs.     Asthma/Respiratory Symptom History: She has been experiencing ongoing respiratory issues, including a chronic cough that worsens every fall and sometimes in the spring. The cough is persistent, occurs  immediately after eating, and feels like something is stuck in her chest. These symptoms have persisted for at least 15 years, requiring rounds of antibiotics and nebulizer treatments. She uses two inhalers: Breztri twice daily and a rescue inhaler as needed. She also has a nebulizer but uses it primarily during exacerbations.. She is followed by Dr. Molinda Packer.   IMPRESSION:  1. Scattered apparent migratory areas of groundglass opacity some of which are increased and others decreased from the previous exam. This is nonspecific and may be related to atypical infection, hypersensitivity pneumonitis, COP, eosinophilic pneumonia or alveolar proteinosis among other possibilities. Neoplasm would be much less likely and follow-up at 6 months to one year can be obtained as indicated.  2.  Thyroid  nodules previously evaluated.  3.  Mild hepatomegaly with probable hepatic steatosis.   She has had an autoimmune workup including an ANA which was negative at some point.   Otherwise, there have been no changes to her past medical history, surgical history, family history, or social history.    Review of systems otherwise negative other than that mentioned in the HPI.    Objective:   Blood pressure 128/88, pulse 93, temperature 98 F (36.7 C), height 5' 3.39 (1.61 m), weight (!) 303 lb 8 oz (137.7 kg), SpO2 91%. Body mass index is 53.11 kg/m.    Physical Exam Vitals reviewed.  Constitutional:      Appearance: She is well-developed.  HENT:     Head: Normocephalic and atraumatic.     Right Ear: Tympanic membrane, ear canal and external ear normal. No drainage, swelling or tenderness. Tympanic membrane is not injected, scarred, erythematous, retracted or bulging.     Left Ear: Tympanic membrane, ear canal and external ear normal. No drainage, swelling or tenderness. Tympanic membrane is not injected, scarred, erythematous, retracted or bulging.     Nose: No nasal deformity, septal deviation,  mucosal edema or rhinorrhea.     Right Turbinates: Enlarged, swollen and pale.     Left Turbinates: Enlarged, swollen and pale.     Right Sinus: No maxillary sinus tenderness or frontal sinus tenderness.     Left Sinus: No maxillary sinus tenderness or frontal sinus tenderness.     Mouth/Throat:     Mouth: Mucous membranes are not pale and not dry.     Pharynx: Uvula midline.  Eyes:     General:        Right eye: No discharge.        Left eye: No discharge.     Conjunctiva/sclera: Conjunctivae normal.     Right eye: Right conjunctiva is not injected. No chemosis.    Left eye: Left conjunctiva is not injected. No chemosis.    Pupils: Pupils are equal, round, and reactive to light.  Cardiovascular:     Rate and Rhythm: Normal rate and regular rhythm.     Heart sounds: Normal heart sounds.  Pulmonary:     Effort: Pulmonary effort is normal. No tachypnea, accessory muscle usage or respiratory distress.     Breath sounds: Normal breath sounds. No wheezing, rhonchi or  rales.  Chest:     Chest wall: No tenderness.  Abdominal:     Tenderness: There is no abdominal tenderness. There is no guarding or rebound.  Lymphadenopathy:     Head:     Right side of head: No submandibular, tonsillar or occipital adenopathy.     Left side of head: No submandibular, tonsillar or occipital adenopathy.     Cervical: No cervical adenopathy.  Skin:    Coloration: Skin is not pale.     Findings: No abrasion, erythema, petechiae or rash. Rash is not papular, urticarial or vesicular.  Neurological:     Mental Status: She is alert.  Psychiatric:        Behavior: Behavior is cooperative.      Diagnostic studies:    Spirometry: results normal (FEV1: 2.19/89%, FVC: 2.72/88%, FEV1/FVC: 81%).    Spirometry consistent with normal pattern.    Allergy  Studies: none      Marty Shaggy, MD  [facility] 

## 2024-05-06 ENCOUNTER — Other Ambulatory Visit: Payer: Self-pay | Admitting: *Deleted

## 2024-05-06 ENCOUNTER — Telehealth: Payer: Self-pay | Admitting: *Deleted

## 2024-05-06 MED ORDER — EPINEPHRINE 0.3 MG/0.3ML IJ SOAJ: 0.3000 mg | INTRAMUSCULAR | 1 refills | Status: AC | PRN

## 2024-05-06 NOTE — Telephone Encounter (Signed)
 Patient called back and stated that she is still interested in doing the regular build up for allergy  injections. I have scheduled her to start allergy  injections in the [place] office and sent in a prescription for EpiPen  to her pharmacy. Patient verbalized understanding.

## 2024-05-06 NOTE — Telephone Encounter (Signed)
 Called and left a voicemail asking for patient to return call to discuss.

## 2024-05-06 NOTE — Telephone Encounter (Signed)
-----   Message from Marty Morton Shaggy sent at 05/05/2024  4:27 PM EST ----- I thought she was doing to start allergy  shots. Can we call and see if she wanted to do that? I know she might appreciate having them started now since it is the end of the year.

## 2024-05-09 ENCOUNTER — Encounter (INDEPENDENT_AMBULATORY_CARE_PROVIDER_SITE_OTHER): Payer: Self-pay

## 2024-05-13 ENCOUNTER — Telehealth: Payer: Self-pay | Admitting: Allergy & Immunology

## 2024-05-13 DIAGNOSIS — J302 Other seasonal allergic rhinitis: Secondary | ICD-10-CM

## 2024-05-13 NOTE — Telephone Encounter (Signed)
 Allergy  shot prescription written.

## 2024-05-14 DIAGNOSIS — J301 Allergic rhinitis due to pollen: Secondary | ICD-10-CM | POA: Diagnosis not present

## 2024-05-14 DIAGNOSIS — J302 Other seasonal allergic rhinitis: Secondary | ICD-10-CM | POA: Diagnosis not present

## 2024-05-14 DIAGNOSIS — J3089 Other allergic rhinitis: Secondary | ICD-10-CM | POA: Diagnosis not present

## 2024-05-14 NOTE — Progress Notes (Signed)
 Aeroallergen Immunotherapy  Ordering Provider: Marty Shaggy, MD  Patient Details Name: [name] MRN: [id number] Date of Birth: 1965/12/24  Order 2 of 2  Vial Label: Mold/DM  0.2 ml (Volume)  1:20 Concentration -- Alternaria alternata 1.0 ml (Volume)   AU Concentration -- Mite Mix (DF 5,000 & DP 5,000)   1.2  ml Extract Subtotal 3.8  ml Diluent 5.0  ml Maintenance Total  Schedule:  A  Green Vial (1:1,000): Schedule A (10 doses) Blue Vial (1:100): Schedule A (10 doses) Yellow Vial (1:10): Schedule A (10 doses) Red Vial (1:1): Schedule A (14 doses)  Special Instructions: After completion of the first Red Vial, please space to every two weeks. After completion of the second Red Vial, please space to every 4 weeks. Ok to up dose new vials on Schedule D. Ok to come twice weekly, if desired, as long as there is 48 hours between injections.

## 2024-05-14 NOTE — Progress Notes (Signed)
 Aeroallergen Immunotherapy  Ordering Provider: Marty Shaggy, MD  Patient Details Name: [name] MRN: [id number] Date of Birth: 1965/11/01  Order 1 of 2  Vial Label: G/W/T  0.3 ml (Volume)  BAU Concentration -- 7 Grass Mix* 100,000 ([place]  Blue, Burnet, Blackfoot, Perennial Rye, RedTop, Sweet Vernal, Timothy) 0.2 ml (Volume)  1:20 Concentration -- Bahia 0.2 ml (Volume)  1:20 Concentration -- Cocklebur 0.5 ml (Volume)  1:20 Concentration -- Weed Mix* 0.2 ml (Volume)  1:10 Concentration -- Cedar, red 0.2 ml (Volume)  1:20 Concentration -- Sweet Gum* 0.2 ml (Volume)  1:20 Concentration -- Ash mix* 0.2 ml (Volume)  1:10 Concentration -- Hickory* 0.3 ml (Volume)  1:10 Concentration -- Oak, Eastern mix* 0.2 ml (Volume)  1:10 Concentration -- Sycamore Eastern*   2.5  ml Extract Subtotal 2.5  ml Diluent 5.0  ml Maintenance Total  Schedule:  A  Green Vial (1:1,000): Schedule A (10 doses) Blue Vial (1:100): Schedule A (10 doses) Yellow Vial (1:10): Schedule A (10 doses) Red Vial (1:1): Schedule A (14 doses)  Special Instructions: After completion of the first Red Vial, please space to every two weeks. After completion of the second Red Vial, please space to every 4 weeks. Ok to up dose new vials on Schedule D. Ok to come twice weekly, if desired, as long as there is 48 hours between injections.

## 2024-05-14 NOTE — Progress Notes (Signed)
 VIALS MADE ON 05/14/24

## 2024-05-17 LAB — LYMPH ENUMERATION, BASIC & NK CELLS
% CD 3 Pos. Lymph.: 68.6 % (ref 57.5–86.2)
% CD 4 Pos. Lymph.: 58 % (ref 30.8–58.5)
% NK (CD56/16): 13.6 % (ref 1.4–19.4)
Ab NK (CD56/16): 204 /uL (ref 24–406)
Absolute CD 3: 1029 /uL (ref 622–2402)
Absolute CD 4 Helper: 870 /uL (ref 359–1519)
Basophils Absolute: 0 x10E3/uL (ref 0.0–0.2)
Basos: 1 %
CD19 % B Cell: 16.9 % (ref 3.3–25.4)
CD19 Abs: 254 /uL (ref 12–645)
CD4/CD8 Ratio: 5.86 — ABNORMAL HIGH (ref 0.92–3.72)
CD8 % Suppressor T Cell: 9.9 % — ABNORMAL LOW (ref 12.0–35.5)
CD8 T Cell Abs: 149 /uL (ref 109–897)
EOS (ABSOLUTE): 0.2 x10E3/uL (ref 0.0–0.4)
Eos: 5 %
Hematocrit: 43 % (ref 34.0–46.6)
Hemoglobin: 13.5 g/dL (ref 11.1–15.9)
Immature Grans (Abs): 0 x10E3/uL (ref 0.0–0.1)
Immature Granulocytes: 0 %
Lymphocytes Absolute: 1.5 x10E3/uL (ref 0.7–3.1)
Lymphs: 33 %
MCH: 28.2 pg (ref 26.6–33.0)
MCHC: 31.4 g/dL — ABNORMAL LOW (ref 31.5–35.7)
MCV: 90 fL (ref 79–97)
Monocytes Absolute: 0.6 x10E3/uL (ref 0.1–0.9)
Monocytes: 13 %
Neutrophils Absolute: 2.2 x10E3/uL (ref 1.4–7.0)
Neutrophils: 48 %
Platelets: 255 x10E3/uL (ref 150–450)
RBC: 4.79 x10E6/uL (ref 3.77–5.28)
RDW: 13.9 % (ref 11.7–15.4)
WBC: 4.5 x10E3/uL (ref 3.4–10.8)

## 2024-05-17 LAB — IGG 1, 2, 3, AND 4
IgG (Immunoglobin G), Serum: 905 mg/dL (ref 586–1602)
IgG, Subclass 1: 447 mg/dL (ref 248–810)
IgG, Subclass 2: 282 mg/dL (ref 130–555)
IgG, Subclass 3: 58 mg/dL (ref 15–102)
IgG, Subclass 4: 19 mg/dL (ref 2–96)

## 2024-05-20 ENCOUNTER — Ambulatory Visit: Payer: Self-pay | Admitting: Allergy & Immunology

## 2024-05-26 ENCOUNTER — Ambulatory Visit

## 2024-05-26 DIAGNOSIS — J309 Allergic rhinitis, unspecified: Secondary | ICD-10-CM

## 2024-05-26 NOTE — Progress Notes (Signed)
"                                                                        Immunotherapy   Patient Details  Name: LAYAL JAVID MRN: [id number] Date of Birth: 12/05/1965  05/26/2024  Leita FORBES Conant started injections for  G-W-T and MOLD-DM. Following schedule: A  Frequency:2 times per week Epi-Pen:Epi-Pen Available  Consent signed and patient instructions given. Patient waited in office for 30 minutes.   Briea Mcenery E Sherilyn Windhorst 05/26/2024, 1:34 PM   "

## 2024-05-26 NOTE — Telephone Encounter (Signed)
 Patient came in to the office requesting why only certain results were interpreted back to her. I apologized and told patient the her concern message was sent to provider and once Dr. Iva gets the message he will respond to her. Patient thanked.

## 2024-06-02 ENCOUNTER — Encounter (INDEPENDENT_AMBULATORY_CARE_PROVIDER_SITE_OTHER): Payer: Self-pay

## 2024-06-02 ENCOUNTER — Ambulatory Visit (INDEPENDENT_AMBULATORY_CARE_PROVIDER_SITE_OTHER)

## 2024-06-02 VITALS — BP 133/80 | HR 82 | Ht 64.0 in | Wt 300.0 lb

## 2024-06-02 DIAGNOSIS — R053 Chronic cough: Secondary | ICD-10-CM | POA: Diagnosis not present

## 2024-06-02 DIAGNOSIS — E049 Nontoxic goiter, unspecified: Secondary | ICD-10-CM | POA: Diagnosis not present

## 2024-06-02 NOTE — Progress Notes (Signed)
 Dear Dr. Clotilda, Here is my assessment for our mutual patient, [name]. Thank you for allowing me the opportunity to care for your patient. Please do not hesitate to contact me should you have any other questions. Sincerely, Dr. Hadassah Parody  Otolaryngology Clinic Note Referring provider: Dr. Clotilda HPI:   Initial HPI (06/02/2024) 59 year old female with chronic cough and multinodular goiter who presents for evaluation of persistent cough and thyroid  enlargement.  She has had significant severe cough over the last year, particularly worsened in November after upper respiratory illness. Cough is nonproductive Over the last year, cough was mostly triggered with talking or eating or drinking.  Ever since November when she had acute illness cough now occurs consistently throughout the day.  She has had cough so severe that she has had bowel movements.  Had 1 episode of hemoptysis that was followed by dysphonia.  This is now resolved. Feels persistent sensation of excessive mucus in her throat and inability to clear secretions effectively.  She has a history of COPD and possible ILD, seeing pulmonology for this.  Reports that pulmonology is unsure if her cough is due to the lung disease.  Pulmonology planning repeat CT chest in March 2026.  Denies any chest pain, shortness of breath.  She also has a history of goiter, previously biopsied and showed benign nodule.  On a recent CT chest showed the goiter had enlarged and so she is also referred for evaluation of this.  No compressive symptoms.  Known history of left maxillary mucous retention cyst.  PSH: Left craniotomy for left temporal AVM  Independent Review of Additional Tests or Records:  Referral note 05/06/2024 Molinda Clotilda, MD: Cough, refer to ENT.  PFT 10/18/2022 showing severe obstruction with response to BD.  08/07/2023 high concerns for interstitial pneumonia.  Last seen 05/17/2024 planning to repeat CT chest in 6 months and if  infiltrates persist will consider BAL.  Feels the cough is most likely related to asthma and concern for ILD.  Referring to ENT to ensure no concerns for upper airway standpoint  ENT note Vaughan Ricker 06/06/2022: At that time did not think she was inspire candidate.  Ordered CT sinus given sinus concerns.  This showed a mucus retention cyst.  Note 05/02/2024 Marty Shaggy, MD: Seasonal and perennial allergic rhinitis, planning to start allergy  shots PMH/Meds/All/SocHx/FamHx/ROS:   Past Medical History:  Diagnosis Date   ADD (attention deficit disorder)    AVM (arteriovenous malformation)    Hypertension      Past Surgical History:  Procedure Laterality Date   BRAIN SURGERY  2021   CESAREAN SECTION  05/30/1987   HERNIA REPAIR  05/30/2011   Lap Ventral Hernia Repair   PARTIAL HYSTERECTOMY  05/29/2010   TUBAL LIGATION  05/29/1990   VENTRAL HERNIA REPAIR  01/09/2012   Procedure: LAPAROSCOPIC VENTRAL HERNIA;  Surgeon: Morene ONEIDA Olives, MD;  Location: [facility];  Service: General;  Laterality: N/A;  Laparoscopic Ventral Hernia Repair   WISDOM TOOTH EXTRACTION  05/29/2001    Family History  Problem Relation Age of Onset   Asthma Son    Asthma Daughter    Asthma Granddaughter    Allergic rhinitis Neg Hx    Angioedema Neg Hx    Eczema Neg Hx    Immunodeficiency Neg Hx    Urticaria Neg Hx      Social Connections: Moderately Integrated (03/24/2024)   [facility]   Social Network    How would you rate your social network (  family, work, friends)?: Adequate participation with social networks     Current Outpatient Medications  Medication Instructions   albuterol  (VENTOLIN  HFA) 108 (90 Base) MCG/ACT inhaler 2 puffs, Every 6 hours PRN   BREZTRI AEROSPHERE 160-9-4.8 MCG/ACT AERO inhaler 2 puffs, 2 times daily   desvenlafaxine (PRISTIQ) 50 mg   Desvenlafaxine ER (PRISTIQ) 50 MG TB24 1 tablet, Daily   DULoxetine (CYMBALTA) 60 mg, Daily   EPINEPHrine  (EPIPEN  2-PAK) 0.3  mg, Intramuscular, As needed   escitalopram (LEXAPRO) 20 mg, Daily   estradiol (VIVELLE-DOT) 0.05 MG/24HR patch 1 patch, 2 times weekly   levalbuterol (XOPENEX) 1.25 MG/3ML nebulizer solution Take 3 mLs (1.25 mg dose) by nebulization every 4 (four) hours as needed for Shortness of Breath.   lisdexamfetamine (VYVANSE) 40 mg   lisdexamfetamine (VYVANSE) 30 mg, Every morning   lisinopril (ZESTRIL) 10 mg, Daily   methylphenidate  (CONCERTA ) 36 mg, Every morning   mirabegron ER (MYRBETRIQ) 50 MG TB24 tablet Take by mouth.   progesterone (PROMETRIUM) 100-200 mg, Daily at bedtime   rosuvastatin (CRESTOR) 10 MG tablet Take by mouth.   Tiotropium Bromide Monohydrate (SPIRIVA RESPIMAT) 1.25 MCG/ACT AERS Inhale into the lungs.     Physical Exam:   BP 133/80 (BP Location: Left Arm, Patient Position: Sitting)   Pulse 82   Ht 5' 4 (1.626 m)   Wt 300 lb (136.1 kg)   SpO2 94%   BMI 51.49 kg/m   Salient findings:  CN II-XII intact   Bilateral EAC clear and TM intact with well pneumatized middle ear spaces  Anterior rhinoscopy: Septum midline; bilateral inferior turbinates with mild hypertrophy  No lesions of oral cavity/oropharynx  No obviously palpable neck masses/lymphadenopathy.  Mild thyromegaly.   No respiratory distress or stridor TFL was indicated to better evaluate the proximal airway, given the patient's history and exam findings, and is detailed below.  Seprately Identifiable Procedures:  Prior to initiating any procedures, risks/benefits/alternatives were explained to the patient and verbal consent obtained.  Procedure Note (06/02/2024) Pre-procedure diagnosis: Chronic cough Post-procedure diagnosis: Same Procedure: Transnasal Fiberoptic Laryngoscopy, CPT 31575 - Mod 25 Indication: Chronic cough Complications: None apparent EBL: 0 mL  The procedure was undertaken to further evaluate the patient's complaint of chronic cough, with mirror exam inadequate for appropriate  examination due to gag reflex and poor patient tolerance  Procedure:  Patient was identified as correct patient. Verbal consent was obtained. The nose was sprayed with oxymetazoline and 4% lidocaine . The The flexible laryngoscope was passed through the nose to view the nasal cavity, pharynx (oropharynx, hypopharynx) and larynx.  The larynx was examined at rest and during multiple phonatory tasks. Documentation was obtained and reviewed with patient. The scope was removed. The patient tolerated the procedure well.  Findings: The nasal cavity and nasopharynx did not reveal any masses or lesions, mucosa appeared to be without obvious lesions. The tongue base, pharyngeal walls, piriform sinuses, vallecula, epiglottis and postcricoid region are normal in appearance. The visualized portion of the subglottis and proximal trachea is widely patent. The vocal folds are mobile bilaterally. There are no lesions on the free edge of the vocal folds nor elsewhere in the larynx worrisome for malignancy.    Electronically signed by: Hadassah JAYSON Parody, MD 06/02/2024 10:17 AM   Impression & Plans:  Kataleya Zaugg is a 59 y.o. female with   1. Chronic cough   2. Goiter    Assessment and Plan Assessment & Plan Chronic cough due to laryngeal hypersensitivity She has chronic, severe nonproductive  cough.  She follows with pulmonology and has a history of COPD and possible ILD, however unclear if her cough is due to the lung findings.  She does have a history consistent with upper airway cough syndrome, including triggers such as eating or drinking, talking.  Cough significantly worsened after recent viral illness.  Endoscopic evaluation revealed no structural abnormalities or lesions.  - Reviewed treatment options: voice therapy (cough suppression therapy) as first-line, with gabapentin (noting risk of sedation) or SLN block as alternatives if needed. - Recommended referral to voice therapy (cough suppression therapy)  with a specialist  multinodular goiter She has longstanding multinodular goiter.  Further evaluation is required to assess for interval growth or features concerning for malignancy. - Ordered thyroid  ultrasound to assess nodule characteristics and determine need for repeat biopsy.  Follow-up in 3 months after thyroid  ultrasound    See below regarding exact medications prescribed this encounter including dosages and route: No orders of the defined types were placed in this encounter.     Thank you for allowing me the opportunity to care for your patient. Please do not hesitate to contact me should you have any other questions.  Sincerely, Hadassah Parody, MD Otolaryngologist (ENT), [facility] Phone: [phone] Fax: [phone]  MDM:  Level 4 Complexity/Problems addressed: 4-multiple chronic problem Data complexity: 4-  independent review of 3 notes - Morbidity:  - Prescription Drug prescribed or managed: no

## 2024-06-04 ENCOUNTER — Ambulatory Visit

## 2024-06-04 DIAGNOSIS — J302 Other seasonal allergic rhinitis: Secondary | ICD-10-CM

## 2024-06-05 ENCOUNTER — Ambulatory Visit (HOSPITAL_COMMUNITY)
Admission: RE | Admit: 2024-06-05 | Discharge: 2024-06-05 | Disposition: A | Discharge status: Home / Self Care | Source: Ambulatory Visit

## 2024-06-05 DIAGNOSIS — E049 Nontoxic goiter, unspecified: Secondary | ICD-10-CM | POA: Diagnosis present

## 2024-06-11 ENCOUNTER — Ambulatory Visit (INDEPENDENT_AMBULATORY_CARE_PROVIDER_SITE_OTHER)

## 2024-06-11 DIAGNOSIS — J302 Other seasonal allergic rhinitis: Secondary | ICD-10-CM | POA: Diagnosis not present

## 2024-06-18 ENCOUNTER — Ambulatory Visit

## 2024-06-18 DIAGNOSIS — J302 Other seasonal allergic rhinitis: Secondary | ICD-10-CM | POA: Diagnosis not present

## 2024-06-25 ENCOUNTER — Ambulatory Visit

## 2024-06-25 DIAGNOSIS — J302 Other seasonal allergic rhinitis: Secondary | ICD-10-CM | POA: Diagnosis not present

## 2024-07-02 ENCOUNTER — Ambulatory Visit

## 2024-07-02 DIAGNOSIS — J302 Other seasonal allergic rhinitis: Secondary | ICD-10-CM | POA: Diagnosis not present

## 2024-07-03 ENCOUNTER — Ambulatory Visit (INDEPENDENT_AMBULATORY_CARE_PROVIDER_SITE_OTHER): Payer: Self-pay

## 2024-08-08 ENCOUNTER — Ambulatory Visit: Admitting: Family Medicine

## 2024-09-04 ENCOUNTER — Ambulatory Visit (INDEPENDENT_AMBULATORY_CARE_PROVIDER_SITE_OTHER)
